# Patient Record
Sex: Female | Born: 1975 | Race: Black or African American | Hispanic: No | Smoking: Never smoker
Health system: Southern US, Community
[De-identification: ages and names within clinical notes are randomized; demographics above are authoritative.]

## PROBLEM LIST (undated history)

## (undated) DIAGNOSIS — M797 Fibromyalgia: Secondary | ICD-10-CM

## (undated) DIAGNOSIS — E785 Hyperlipidemia, unspecified: Secondary | ICD-10-CM

## (undated) DIAGNOSIS — J40 Bronchitis, not specified as acute or chronic: Secondary | ICD-10-CM

## (undated) HISTORY — PX: HERNIA REPAIR: SHX51

## (undated) HISTORY — DX: Hyperlipidemia, unspecified: E78.5

## (undated) HISTORY — DX: Bronchitis, not specified as acute or chronic: J40

## (undated) HISTORY — PX: TUBAL LIGATION: SHX77

---

## 2014-01-05 HISTORY — PX: BUNIONECTOMY: SHX129

## 2015-03-11 ENCOUNTER — Other Ambulatory Visit: Payer: Self-pay | Admitting: Physician Assistant

## 2015-03-11 DIAGNOSIS — R1011 Right upper quadrant pain: Secondary | ICD-10-CM

## 2015-03-12 ENCOUNTER — Ambulatory Visit
Admission: RE | Admit: 2015-03-12 | Discharge: 2015-03-12 | Disposition: A | Discharge status: Home / Self Care | Payer: Medicare Other | Source: Ambulatory Visit | Attending: Physician Assistant | Admitting: Physician Assistant

## 2015-03-12 DIAGNOSIS — R1011 Right upper quadrant pain: Secondary | ICD-10-CM

## 2015-03-13 ENCOUNTER — Emergency Department (HOSPITAL_COMMUNITY)
Admission: EM | Admit: 2015-03-13 | Discharge: 2015-03-13 | Disposition: A | Discharge status: Home / Self Care | Payer: Medicare Other | Attending: Emergency Medicine | Admitting: Emergency Medicine

## 2015-03-13 ENCOUNTER — Encounter (HOSPITAL_COMMUNITY): Payer: Self-pay | Admitting: Family Medicine

## 2015-03-13 DIAGNOSIS — R1011 Right upper quadrant pain: Secondary | ICD-10-CM | POA: Diagnosis present

## 2015-03-13 DIAGNOSIS — R110 Nausea: Secondary | ICD-10-CM | POA: Diagnosis not present

## 2015-03-13 HISTORY — DX: Fibromyalgia: M79.7

## 2015-03-13 LAB — I-STAT BETA HCG BLOOD, ED (MC, WL, AP ONLY)

## 2015-03-13 LAB — COMPREHENSIVE METABOLIC PANEL
ALT: 22 U/L (ref 14–54)
AST: 20 U/L (ref 15–41)
Albumin: 3.6 g/dL (ref 3.5–5.0)
Alkaline Phosphatase: 53 U/L (ref 38–126)
Anion gap: 11 (ref 5–15)
BILIRUBIN TOTAL: 0.4 mg/dL (ref 0.3–1.2)
BUN: 8 mg/dL (ref 6–20)
CO2: 23 mmol/L (ref 22–32)
CREATININE: 0.91 mg/dL (ref 0.44–1.00)
Calcium: 9.6 mg/dL (ref 8.9–10.3)
Chloride: 105 mmol/L (ref 101–111)
GFR calc Af Amer: >60 mL/min (ref >60)
Glucose, Bld: 84 mg/dL (ref 65–99)
POTASSIUM: 3.5 mmol/L (ref 3.5–5.1)
Sodium: 139 mmol/L (ref 135–145)
TOTAL PROTEIN: 7.1 g/dL (ref 6.5–8.1)

## 2015-03-13 LAB — CBC
HEMATOCRIT: 37.9 % (ref 36.0–46.0)
Hemoglobin: 12.6 g/dL (ref 12.0–15.0)
MCH: 28.2 pg (ref 26.0–34.0)
MCHC: 33.2 g/dL (ref 30.0–36.0)
MCV: 84.8 fL (ref 78.0–100.0)
PLATELETS: 289 10*3/uL (ref 150–400)
RBC: 4.47 MIL/uL (ref 3.87–5.11)
RDW: 13.8 % (ref 11.5–15.5)
WBC: 5.1 10*3/uL (ref 4.0–10.5)

## 2015-03-13 LAB — LIPASE, BLOOD: Lipase: 34 U/L (ref 11–51)

## 2015-03-13 NOTE — ED Notes (Signed)
Pt called for in waiting room. NO RESPONSE

## 2015-03-13 NOTE — ED Notes (Signed)
t here for RUQ pain and nausea. Worse after eating. sts x 1 month

## 2015-03-13 NOTE — ED Notes (Signed)
Last call for pt in waiting area. No response

## 2015-03-21 ENCOUNTER — Other Ambulatory Visit: Payer: Self-pay | Admitting: Physician Assistant

## 2015-03-21 DIAGNOSIS — R1011 Right upper quadrant pain: Secondary | ICD-10-CM

## 2015-04-03 ENCOUNTER — Ambulatory Visit
Admission: RE | Admit: 2015-04-03 | Discharge: 2015-04-03 | Disposition: A | Discharge status: Home / Self Care | Payer: Medicare Other | Source: Ambulatory Visit | Attending: Physician Assistant | Admitting: Physician Assistant

## 2015-04-03 DIAGNOSIS — R1011 Right upper quadrant pain: Secondary | ICD-10-CM

## 2015-04-03 MED ORDER — IOPAMIDOL (ISOVUE-300) INJECTION 61%
125.0000 mL | Freq: Once | INTRAVENOUS | Status: AC | PRN
  Administered 2015-04-03: 125 mL via INTRAVENOUS

## 2015-04-29 ENCOUNTER — Other Ambulatory Visit (HOSPITAL_COMMUNITY): Payer: Self-pay | Admitting: Physician Assistant

## 2015-04-29 DIAGNOSIS — R1011 Right upper quadrant pain: Secondary | ICD-10-CM

## 2015-05-08 ENCOUNTER — Ambulatory Visit (HOSPITAL_COMMUNITY)
Admission: RE | Admit: 2015-05-08 | Discharge: 2015-05-08 | Disposition: A | Discharge status: Home / Self Care | Payer: Medicare Other | Source: Ambulatory Visit | Attending: Physician Assistant | Admitting: Physician Assistant

## 2015-05-08 DIAGNOSIS — R1011 Right upper quadrant pain: Secondary | ICD-10-CM | POA: Insufficient documentation

## 2015-05-08 MED ORDER — TECHNETIUM TC 99M MEBROFENIN IV KIT
5.0000 | PACK | Freq: Once | INTRAVENOUS | Status: AC | PRN
  Administered 2015-05-08: 5 via INTRAVENOUS

## 2015-10-23 ENCOUNTER — Encounter (HOSPITAL_COMMUNITY): Payer: Self-pay | Admitting: Emergency Medicine

## 2015-10-23 DIAGNOSIS — Y999 Unspecified external cause status: Secondary | ICD-10-CM | POA: Diagnosis not present

## 2015-10-23 DIAGNOSIS — Y939 Activity, unspecified: Secondary | ICD-10-CM | POA: Insufficient documentation

## 2015-10-23 DIAGNOSIS — R1013 Epigastric pain: Secondary | ICD-10-CM | POA: Insufficient documentation

## 2015-10-23 DIAGNOSIS — M791 Myalgia: Secondary | ICD-10-CM | POA: Insufficient documentation

## 2015-10-23 DIAGNOSIS — S63502A Unspecified sprain of left wrist, initial encounter: Secondary | ICD-10-CM | POA: Insufficient documentation

## 2015-10-23 DIAGNOSIS — Y92410 Unspecified street and highway as the place of occurrence of the external cause: Secondary | ICD-10-CM | POA: Diagnosis not present

## 2015-10-23 DIAGNOSIS — S6992XA Unspecified injury of left wrist, hand and finger(s), initial encounter: Secondary | ICD-10-CM | POA: Diagnosis present
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
## 2015-10-24 ENCOUNTER — Emergency Department (HOSPITAL_COMMUNITY)
Admission: EM | Admit: 2015-10-24 | Discharge: 2015-10-24 | Disposition: A | Discharge status: Home / Self Care | Payer: No Typology Code available for payment source | Attending: Emergency Medicine | Admitting: Emergency Medicine

## 2015-10-24 ENCOUNTER — Emergency Department (HOSPITAL_COMMUNITY): Payer: No Typology Code available for payment source

## 2015-10-24 DIAGNOSIS — S63502A Unspecified sprain of left wrist, initial encounter: Secondary | ICD-10-CM | POA: Diagnosis not present

## 2015-10-24 MED ORDER — HYDROCODONE-ACETAMINOPHEN 5-325 MG PO TABS
1.0000 | ORAL_TABLET | Freq: Once | ORAL | Status: AC
  Administered 2015-10-24: 1 via ORAL
  Filled 2015-10-24: qty 1

## 2015-10-24 MED ORDER — HYDROCODONE-ACETAMINOPHEN 5-325 MG PO TABS: 1.0000 | ORAL_TABLET | ORAL | 0 refills | Status: DC | PRN
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
## 2016-05-07 DIAGNOSIS — M797 Fibromyalgia: Secondary | ICD-10-CM | POA: Diagnosis not present

## 2016-05-07 DIAGNOSIS — I10 Essential (primary) hypertension: Secondary | ICD-10-CM | POA: Diagnosis not present

## 2016-05-07 DIAGNOSIS — E785 Hyperlipidemia, unspecified: Secondary | ICD-10-CM | POA: Diagnosis not present

## 2016-05-07 DIAGNOSIS — K219 Gastro-esophageal reflux disease without esophagitis: Secondary | ICD-10-CM | POA: Diagnosis not present

## 2016-05-29 ENCOUNTER — Other Ambulatory Visit (HOSPITAL_BASED_OUTPATIENT_CLINIC_OR_DEPARTMENT_OTHER): Payer: Self-pay

## 2016-05-29 DIAGNOSIS — G4730 Sleep apnea, unspecified: Secondary | ICD-10-CM

## 2016-06-02 DIAGNOSIS — E785 Hyperlipidemia, unspecified: Secondary | ICD-10-CM | POA: Diagnosis not present

## 2016-06-02 DIAGNOSIS — K219 Gastro-esophageal reflux disease without esophagitis: Secondary | ICD-10-CM | POA: Diagnosis not present

## 2016-06-02 DIAGNOSIS — Z0000 Encounter for general adult medical examination without abnormal findings: Secondary | ICD-10-CM | POA: Diagnosis not present

## 2016-06-02 DIAGNOSIS — M797 Fibromyalgia: Secondary | ICD-10-CM | POA: Diagnosis not present

## 2016-06-02 DIAGNOSIS — I10 Essential (primary) hypertension: Secondary | ICD-10-CM | POA: Diagnosis not present

## 2016-06-24 DIAGNOSIS — Z1231 Encounter for screening mammogram for malignant neoplasm of breast: Secondary | ICD-10-CM | POA: Diagnosis not present

## 2016-06-26 ENCOUNTER — Ambulatory Visit (HOSPITAL_BASED_OUTPATIENT_CLINIC_OR_DEPARTMENT_OTHER): Payer: Medicare Other | Attending: Internal Medicine | Admitting: Internal Medicine

## 2016-06-26 VITALS — Ht 67.0 in | Wt 285.0 lb

## 2016-06-26 DIAGNOSIS — R5383 Other fatigue: Secondary | ICD-10-CM | POA: Insufficient documentation

## 2016-06-26 DIAGNOSIS — Z6841 Body Mass Index (BMI) 40.0 and over, adult: Secondary | ICD-10-CM | POA: Diagnosis not present

## 2016-06-26 DIAGNOSIS — R0683 Snoring: Secondary | ICD-10-CM | POA: Diagnosis not present

## 2016-06-26 DIAGNOSIS — G4730 Sleep apnea, unspecified: Secondary | ICD-10-CM

## 2016-06-26 DIAGNOSIS — G4733 Obstructive sleep apnea (adult) (pediatric): Secondary | ICD-10-CM | POA: Diagnosis not present

## 2016-06-26 DIAGNOSIS — E669 Obesity, unspecified: Secondary | ICD-10-CM | POA: Insufficient documentation

## 2016-07-03 DIAGNOSIS — I10 Essential (primary) hypertension: Secondary | ICD-10-CM | POA: Diagnosis not present

## 2016-07-03 DIAGNOSIS — J069 Acute upper respiratory infection, unspecified: Secondary | ICD-10-CM | POA: Diagnosis not present

## 2016-07-03 DIAGNOSIS — M797 Fibromyalgia: Secondary | ICD-10-CM | POA: Diagnosis not present

## 2016-07-03 DIAGNOSIS — R05 Cough: Secondary | ICD-10-CM | POA: Diagnosis not present

## 2016-07-03 DIAGNOSIS — E785 Hyperlipidemia, unspecified: Secondary | ICD-10-CM | POA: Diagnosis not present

## 2016-07-05 DIAGNOSIS — G4733 Obstructive sleep apnea (adult) (pediatric): Secondary | ICD-10-CM | POA: Diagnosis not present

## 2016-07-05 NOTE — Procedures (Signed)
  Patient Name: Joanna Allen, Joanna Allen Date: 06/26/2016 Gender: Female D.O.B: 1975/12/21 Age (years): 40 Referring Provider: Doristine Section Bonsu Height (inches): 93 Interpreting Physician: Baird Lyons MD, ABSM Weight (lbs): 285 RPSGT: Baxter Flattery BMI: 30 MRN: [id number] Neck Size: 15.50 CLINICAL INFORMATION Sleep Study Type: NPSG  Indication for sleep study: Fatigue, Obesity, OSA, Witnessed Apneas  Epworth Sleepiness Score:   15/ 24  SLEEP STUDY TECHNIQUE As per the AASM Manual for the Scoring of Sleep and Associated Events v2.3 (April 2016) with a hypopnea requiring 4% desaturations.  The channels recorded and monitored were frontal, central and occipital EEG, electrooculogram (EOG), submentalis EMG (chin), nasal and oral airflow, thoracic and abdominal wall motion, anterior tibialis EMG, snore microphone, electrocardiogram, and pulse oximetry.  MEDICATIONS Medications self-administered by patient taken the night of the study : none reported  SLEEP ARCHITECTURE The study was initiated at 10:04:09 PM and ended at 4:30:50 AM.  Sleep onset time was 45.6 minutes and the sleep efficiency was 67.6%. The total sleep time was 261.6 minutes.  Stage REM latency was 85.0 minutes.  The patient spent 11.47% of the night in stage N1 sleep, 71.71% in stage N2 sleep, 0.00% in stage N3 and 16.82% in REM.  Alpha intrusion was absent.  Supine sleep was 0.19%.  RESPIRATORY PARAMETERS The overall apnea/hypopnea index (AHI) was 12.4 per hour. There were 10 total apneas, including 1 obstructive, 8 central and 1 mixed apneas. There were 44 hypopneas and 0 RERAs.  The AHI during Stage REM sleep was 61.4 per hour.  AHI while supine was 0.0 per hour.  The mean oxygen saturation was 93.50%. The minimum SpO2 during sleep was 78.00%.  Soft snoring was noted during this study.  CARDIAC DATA The 2 lead EKG demonstrated sinus rhythm. The mean heart rate was 81.38 beats per minute. Other EKG  findings include: None.  LEG MOVEMENT DATA The total PLMS were 0 with a resulting PLMS index of 0.00. Associated arousal with leg movement index was 0.0 .  IMPRESSIONS - Mild obstructive sleep apnea occurred during this study (AHI = 12.4/h). - No significant central sleep apnea occurred during this study (CAI = 1.8/h). - Moderate oxygen desaturation was noted during this study (Min O2 = 78.00%). - The patient snored with Soft snoring volume. - No cardiac abnormalities were noted during this study. - Clinically significant periodic limb movements did not occur during sleep. No significant associated arousals.  DIAGNOSIS - Obstructive Sleep Apnea (327.23 [G47.33 ICD-10])  RECOMMENDATIONS - Therapeutic CPAP titration to determine optimal pressure required to alleviate sleep disordered breathing. Other options based on clinical judgment. - Avoid alcohol, sedatives and other CNS depressants that may worsen sleep apnea and disrupt normal sleep architecture. - Sleep hygiene should be reviewed to assess factors that may improve sleep quality. - Weight management and regular exercise should be initiated or continued if appropriate.  [Electronically signed] 07/05/2016 11:12 AM  Baird Lyons MD, ABSM Diplomate, American Board of Sleep Medicine   NPI: [id number]  [place], American Board of Sleep Medicine  ELECTRONICALLY SIGNED ON:  07/05/2016, 10:53 AM [place] PH: [phone]   FX: [phone] [place]

## 2016-07-15 DIAGNOSIS — R05 Cough: Secondary | ICD-10-CM | POA: Diagnosis not present

## 2016-07-15 DIAGNOSIS — E785 Hyperlipidemia, unspecified: Secondary | ICD-10-CM | POA: Diagnosis not present

## 2016-07-29 DIAGNOSIS — E559 Vitamin D deficiency, unspecified: Secondary | ICD-10-CM | POA: Diagnosis not present

## 2016-07-29 DIAGNOSIS — K219 Gastro-esophageal reflux disease without esophagitis: Secondary | ICD-10-CM | POA: Diagnosis not present

## 2016-07-29 DIAGNOSIS — M797 Fibromyalgia: Secondary | ICD-10-CM | POA: Diagnosis not present

## 2016-07-29 DIAGNOSIS — I10 Essential (primary) hypertension: Secondary | ICD-10-CM | POA: Diagnosis not present

## 2016-08-26 DIAGNOSIS — M797 Fibromyalgia: Secondary | ICD-10-CM | POA: Diagnosis not present

## 2016-08-26 DIAGNOSIS — Z79899 Other long term (current) drug therapy: Secondary | ICD-10-CM | POA: Diagnosis not present

## 2016-09-01 ENCOUNTER — Other Ambulatory Visit (HOSPITAL_BASED_OUTPATIENT_CLINIC_OR_DEPARTMENT_OTHER): Payer: Self-pay

## 2016-09-01 DIAGNOSIS — G4733 Obstructive sleep apnea (adult) (pediatric): Secondary | ICD-10-CM

## 2016-09-22 DIAGNOSIS — I10 Essential (primary) hypertension: Secondary | ICD-10-CM | POA: Diagnosis not present

## 2016-09-22 DIAGNOSIS — E785 Hyperlipidemia, unspecified: Secondary | ICD-10-CM | POA: Diagnosis not present

## 2016-09-22 DIAGNOSIS — M797 Fibromyalgia: Secondary | ICD-10-CM | POA: Diagnosis not present

## 2016-09-22 DIAGNOSIS — K219 Gastro-esophageal reflux disease without esophagitis: Secondary | ICD-10-CM | POA: Diagnosis not present

## 2016-09-23 DIAGNOSIS — M797 Fibromyalgia: Secondary | ICD-10-CM | POA: Diagnosis not present

## 2016-09-23 DIAGNOSIS — Z7689 Persons encountering health services in other specified circumstances: Secondary | ICD-10-CM | POA: Diagnosis not present

## 2016-09-25 DIAGNOSIS — M797 Fibromyalgia: Secondary | ICD-10-CM | POA: Diagnosis not present

## 2016-09-25 DIAGNOSIS — Z79899 Other long term (current) drug therapy: Secondary | ICD-10-CM | POA: Diagnosis not present

## 2016-10-02 ENCOUNTER — Ambulatory Visit (HOSPITAL_BASED_OUTPATIENT_CLINIC_OR_DEPARTMENT_OTHER): Payer: Medicare Other | Attending: Internal Medicine | Admitting: Internal Medicine

## 2016-10-02 VITALS — Ht 67.0 in | Wt 285.0 lb

## 2016-10-02 DIAGNOSIS — G4733 Obstructive sleep apnea (adult) (pediatric): Secondary | ICD-10-CM

## 2016-10-02 DIAGNOSIS — R0683 Snoring: Secondary | ICD-10-CM | POA: Insufficient documentation

## 2016-10-04 ENCOUNTER — Encounter (HOSPITAL_COMMUNITY): Payer: Self-pay

## 2016-10-04 ENCOUNTER — Inpatient Hospital Stay (HOSPITAL_COMMUNITY)
Admission: AD | Admit: 2016-10-04 | Discharge: 2016-10-04 | Disposition: A | Discharge status: Home / Self Care | Payer: Medicare Other | Source: Ambulatory Visit | Attending: Family Medicine | Admitting: Family Medicine

## 2016-10-04 DIAGNOSIS — N946 Dysmenorrhea, unspecified: Secondary | ICD-10-CM | POA: Diagnosis not present

## 2016-10-04 DIAGNOSIS — N915 Oligomenorrhea, unspecified: Secondary | ICD-10-CM | POA: Diagnosis not present

## 2016-10-04 DIAGNOSIS — N760 Acute vaginitis: Secondary | ICD-10-CM

## 2016-10-04 DIAGNOSIS — Z3202 Encounter for pregnancy test, result negative: Secondary | ICD-10-CM | POA: Insufficient documentation

## 2016-10-04 DIAGNOSIS — B9689 Other specified bacterial agents as the cause of diseases classified elsewhere: Secondary | ICD-10-CM

## 2016-10-04 DIAGNOSIS — N939 Abnormal uterine and vaginal bleeding, unspecified: Secondary | ICD-10-CM

## 2016-10-04 LAB — URINALYSIS, ROUTINE W REFLEX MICROSCOPIC
Bilirubin Urine: NEGATIVE
Glucose, UA: NEGATIVE mg/dL
KETONES UR: NEGATIVE mg/dL
Leukocytes, UA: NEGATIVE
Nitrite: NEGATIVE
PH: 5 (ref 5.0–8.0)
PROTEIN: NEGATIVE mg/dL
Specific Gravity, Urine: 1.026 (ref 1.005–1.030)

## 2016-10-04 LAB — CBC
HCT: 36.3 % (ref 36.0–46.0)
Hemoglobin: 11.8 g/dL — ABNORMAL LOW (ref 12.0–15.0)
MCH: 28.2 pg (ref 26.0–34.0)
MCHC: 32.5 g/dL (ref 30.0–36.0)
MCV: 86.8 fL (ref 78.0–100.0)
PLATELETS: 275 10*3/uL (ref 150–400)
RBC: 4.18 MIL/uL (ref 3.87–5.11)
RDW: 14.5 % (ref 11.5–15.5)
WBC: 5.7 10*3/uL (ref 4.0–10.5)

## 2016-10-04 LAB — POCT PREGNANCY, URINE: Preg Test, Ur: NEGATIVE

## 2016-10-04 LAB — WET PREP, GENITAL
Sperm: NONE SEEN
TRICH WET PREP: NONE SEEN
YEAST WET PREP: NONE SEEN

## 2016-10-04 LAB — HCG, SERUM, QUALITATIVE: PREG SERUM: NEGATIVE

## 2016-10-04 MED ORDER — IBUPROFEN 600 MG PO TABS: 600.0000 mg | ORAL_TABLET | Freq: Four times a day (QID) | ORAL | 0 refills | Status: DC | PRN

## 2016-10-04 MED ORDER — KETOROLAC TROMETHAMINE 60 MG/2ML IM SOLN
60.0000 mg | Freq: Once | INTRAMUSCULAR | Status: AC
  Administered 2016-10-04: 60 mg via INTRAMUSCULAR
  Filled 2016-10-04: qty 2

## 2016-10-04 MED ORDER — METRONIDAZOLE 500 MG PO TABS: 500.0000 mg | ORAL_TABLET | Freq: Two times a day (BID) | ORAL | 0 refills | Status: DC

## 2016-10-04 NOTE — MAU Provider Note (Cosign Needed)
Chief Complaint: Metrorrhagia   None     SUBJECTIVE HPI: Joanna Allen is a 41 y.o. G4P4000 who presents to maternity admissions reporting more frequent periods in recent months, associated with increased menstrual cramping and abdominal pain.  The pain is intermittent, bilateral low to mid abdomen with slightly more pain to the right side.  She reports that her pain started with her period in August, which came a few days early, then her period in early September was only 1 day long. She started bleeding today with moderate menstrual flow.  She has hx of fibromyalgia and is taking Lyrica and Tylenol for pain which is not helping.  She has Rx for narcotic medication for her fibromyalgia but she reports she does not take it very often and has not taken it for this pain.  She does not report a change in discharge but has noticed an odor in recent weeks.  There are no other associated symptoms. She denies vaginal itching/burning, urinary symptoms, h/a, dizziness, n/v, or fever/chills.     HPI  Past Medical History:  Diagnosis Date  . Fibromyalgia    Past Surgical History:  Procedure Laterality Date  . HERNIA REPAIR    . TUBAL LIGATION     Social History   Social History  . Marital status: Legally Separated    Spouse name: N/A  . Number of children: N/A  . Years of education: N/A   Occupational History  . Not on file.   Social History Main Topics  . Smoking status: Never Smoker  . Smokeless tobacco: Never Used  . Alcohol use Yes  . Drug use: No  . Sexual activity: Yes    Birth control/ protection: Surgical   Other Topics Concern  . Not on file   Social History Narrative  . No narrative on file   No current facility-administered medications on file prior to encounter.    No current outpatient prescriptions on file prior to encounter.   Allergies  Allergen Reactions  . Amoxicillin Hives    ROS:  Review of Systems  Constitutional: Negative for chills, fatigue and  fever.  Respiratory: Negative for shortness of breath.   Cardiovascular: Negative for chest pain.  Gastrointestinal: Positive for abdominal pain.  Genitourinary: Positive for pelvic pain and vaginal bleeding. Negative for difficulty urinating, dysuria, flank pain, vaginal discharge and vaginal pain.  Neurological: Negative for dizziness and headaches.  Psychiatric/Behavioral: Negative.      I have reviewed patient's Past Medical Hx, Surgical Hx, Family Hx, Social Hx, medications and allergies.   Physical Exam  No data found.  Constitutional: Well-developed, well-nourished female in no acute distress.  Cardiovascular: normal rate Respiratory: normal effort GI: Abd soft, non-tender. Pos BS x 4 MS: Extremities nontender, no edema, normal ROM Neurologic: Alert and oriented x 4.  GU: Neg CVAT.  PELVIC EXAM: Wet prep/GC collected by blind swab    LAB RESULTS Results for orders placed or performed during the hospital encounter of 10/04/16 (from the past 24 hour(s))  Urinalysis, Routine w reflex microscopic     Status: Abnormal   Collection Time: 10/04/16  2:55 PM  Result Value Ref Range   Color, Urine YELLOW YELLOW   APPearance HAZY (A) CLEAR   Specific Gravity, Urine 1.026 1.005 - 1.030   pH 5.0 5.0 - 8.0   Glucose, UA NEGATIVE NEGATIVE mg/dL   Hgb urine dipstick LARGE (A) NEGATIVE   Bilirubin Urine NEGATIVE NEGATIVE   Ketones, ur NEGATIVE NEGATIVE mg/dL   Protein,  ur NEGATIVE NEGATIVE mg/dL   Nitrite NEGATIVE NEGATIVE   Leukocytes, UA NEGATIVE NEGATIVE   RBC / HPF 6-30 0 - 5 RBC/hpf   WBC, UA 0-5 0 - 5 WBC/hpf   Bacteria, UA RARE (A) NONE SEEN   Squamous Epithelial / LPF 0-5 (A) NONE SEEN   Mucus PRESENT   Pregnancy, urine POC     Status: None   Collection Time: 10/04/16  3:02 PM  Result Value Ref Range   Preg Test, Ur NEGATIVE NEGATIVE  hCG, serum, qualitative     Status: None   Collection Time: 10/04/16  3:40 PM  Result Value Ref Range   Preg, Serum NEGATIVE  NEGATIVE  CBC     Status: Abnormal   Collection Time: 10/04/16  3:40 PM  Result Value Ref Range   WBC 5.7 4.0 - 10.5 K/uL   RBC 4.18 3.87 - 5.11 MIL/uL   Hemoglobin 11.8 (L) 12.0 - 15.0 g/dL   HCT 36.3 36.0 - 46.0 %   MCV 86.8 78.0 - 100.0 fL   MCH 28.2 26.0 - 34.0 pg   MCHC 32.5 30.0 - 36.0 g/dL   RDW 14.5 11.5 - 15.5 %   Platelets 275 150 - 400 K/uL  Wet prep, genital     Status: Abnormal   Collection Time: 10/04/16  4:29 PM  Result Value Ref Range   Yeast Wet Prep HPF POC NONE SEEN NONE SEEN   Trich, Wet Prep NONE SEEN NONE SEEN   Clue Cells Wet Prep HPF POC PRESENT (A) NONE SEEN   WBC, Wet Prep HPF POC FEW (A) NONE SEEN   Sperm NONE SEEN        IMAGING No results found.  MAU Management/MDM: Ordered labs and reviewed results.  Discussed low/normal hgb with pt, encouraged increased iron in her diet and women's multivitamin.  No acute abdomen, no evidence of infection, other than clue cells on today's testing.  With pt report of vaginal odor, will treat for BV with Flagyl 500 mg BID x 7 days. Bleeding pattern is likely perimenopausal but will further investigate with outpatient [place] and follow up in [facility] office at pt preference.  Rx for Flagyl as mentioned and ibuprofen 600 mg PO Q 6 hours PRN.  Treatments in MAU included Toradol 60 mg IM x 1. Pt stable at time of discharge.  ASSESSMENT 1. Dysmenorrhea   2. Abnormal uterine bleeding (AUB)   3. Bacterial vaginosis     PLAN Discharge home with bleeding precautions Dictation #1 [id number]  [id number]  Allergies as of 10/04/2016      Reactions   Amoxicillin Hives      Medication List    TAKE these medications   acetaminophen 500 MG tablet Commonly known as:  TYLENOL Take 1,000 mg by mouth every 6 (six) hours as needed for mild pain.   cetirizine 10 MG tablet Commonly known as:  ZYRTEC Take 10 mg by mouth daily.   ibuprofen 600 MG tablet Commonly known as:  ADVIL,MOTRIN Take 1 tablet (600 mg total) by  mouth every 6 (six) hours as needed.   metroNIDAZOLE 500 MG tablet Commonly known as:  FLAGYL Take 1 tablet (500 mg total) by mouth 2 (two) times daily.   ranitidine 300 MG tablet Commonly known as:  ZANTAC Take 300 mg by mouth at bedtime.   Vitamin D (Ergocalciferol) 50000 units Caps capsule Commonly known as:  DRISDOL Take 1 capsule by mouth once a week. Saturday  Discharge Care Instructions        Start     Ordered   10/04/16 0000  ibuprofen (ADVIL,MOTRIN) 600 MG tablet  Every 6 hours PRN    Question:  Supervising Provider  Answer:  Donnamae Jude   10/04/16 1822   10/04/16 0000  US PELVIS (TRANSABDOMINAL ONLY)    Question Answer Comment  Reason for exam: abnormal uterine bleeding, dysmenorrhea   [facility]      10/04/16 1825   10/04/16 0000  US PELVIS TRANSVANGINAL NON-OB (TV ONLY)    Question Answer Comment  Reason for exam: abnormal uterine bleeding, dysmenorrhea   Preferred imaging location? [facility]      10/04/16 1825   10/04/16 0000  metroNIDAZOLE (FLAGYL) 500 MG tablet  2 times daily    Question:  Supervising Provider  Answer:  Donnamae Jude   10/04/16 [place] for [place] Follow up.   Specialty:  Obstetrics and Gynecology Why:  The office will call you with follow up  Contact information: [place] Certified Nurse-Midwife 10/04/2016  6:33 PM

## 2016-10-04 NOTE — MAU Note (Signed)
Last few months having irregular periods, starting to come earlier than usually, in September only bled one day,  Two negative UPTs.  Feels heaviness in the bottom of stomach, breast are heavy, milky substance coming out of nipples, have tubal 17 years ago.

## 2016-10-05 LAB — GC/CHLAMYDIA PROBE AMP (~~LOC~~) NOT AT ARMC
Chlamydia: NEGATIVE
Neisseria Gonorrhea: NEGATIVE

## 2016-10-10 DIAGNOSIS — G4733 Obstructive sleep apnea (adult) (pediatric): Secondary | ICD-10-CM | POA: Diagnosis not present

## 2016-10-10 NOTE — Procedures (Signed)
   Patient Name: Joanna Allen, Joanna Allen Date: 10/02/2016 Gender: Female D.O.B: Aug 02, 1975 Age (years): 41 Referring Provider: Doristine Section Bonsu Height (inches): 46 Interpreting Physician: Joanna Lyons MD, ABSM Weight (lbs): 285 RPSGT: Baxter Flattery BMI: 46 MRN: [id number] Neck Size: 15.50 CLINICAL INFORMATION The patient is referred for a CPAP titration to treat sleep apnea.  Date of NPSG, Split Night or HST:  06/26/16-  AHI 12.4/ hr, desaturation to 78%, body weight 285 lbs  SLEEP STUDY TECHNIQUE As per the AASM Manual for the Scoring of Sleep and Associated Events v2.3 (April 2016) with a hypopnea requiring 4% desaturations.  The channels recorded and monitored were frontal, central and occipital EEG, electrooculogram (EOG), submentalis EMG (chin), nasal and oral airflow, thoracic and abdominal wall motion, anterior tibialis EMG, snore microphone, electrocardiogram, and pulse oximetry. Continuous positive airway pressure (CPAP) was initiated at the beginning of the study and titrated to treat sleep-disordered breathing.  MEDICATIONS Medications self-administered by patient taken the night of the study : none reported  TECHNICIAN COMMENTS Comments added by technician: Patient had difficulty initiating sleep.  Comments added by scorer: N/A RESPIRATORY PARAMETERS Optimal PAP Pressure (cm): 11 AHI at Optimal Pressure (/hr): 0.0 Overall Minimal O2 (%): 84.00 Supine % at Optimal Pressure (%): 0 Minimal O2 at Optimal Pressure (%): 96.0    SLEEP ARCHITECTURE The study was initiated at 10:08:57 PM and ended at 4:39:19 AM.  Sleep onset time was 18.6 minutes and the sleep efficiency was 90.5%. The total sleep time was 353.3 minutes.  The patient spent 2.41% of the night in stage N1 sleep, 62.35% in stage N2 sleep, 0.00% in stage N3 and 35.24% in REM.Stage REM latency was 22.5 minutes  Wake after sleep onset was 18.5. Alpha intrusion was absent. Supine sleep was 6.65%.  CARDIAC  DATA The 2 lead EKG demonstrated sinus rhythm. The mean heart rate was 69.96 beats per minute. Other EKG findings include: None.  LEG MOVEMENT DATA The total Periodic Limb Movements of Sleep (PLMS) were 0. The PLMS index was 0.00. A PLMS index of <15 is considered normal in adults.  IMPRESSIONS - The optimal PAP pressure was 11 cm of water. - Central sleep apnea was not noted during this titration (CAI = 0.0/h). - Moderate oxygen desaturations were observed during this titration (min O2 = 84.00%). - No snoring was audible during this study. - No cardiac abnormalities were observed during this study. - Clinically significant periodic limb movements were not noted during this study. Arousals associated with PLMs were rare.  DIAGNOSIS - Obstructive Sleep Apnea (327.23 [G47.33 ICD-10])  RECOMMENDATIONS - Trial of CPAP therapy on 11 cm H2O with a Medium size Resmed Full Face Mask AirFit F20 mask and heated humidification. - Be careful with alcohol, sedatives and other CNS depressants that may worsen sleep apnea and disrupt normal sleep architecture. - Sleep hygiene should be reviewed to assess factors that may improve sleep quality. - Weight management and regular exercise should be initiated or continued.  [Electronically signed] 10/10/2016 11:16 AM  Joanna Lyons MD, ABSM Diplomate, American Board of Sleep Medicine   NPI: [id number]  [place], American Board of Sleep Medicine  ELECTRONICALLY SIGNED ON:  10/10/2016, 11:13 AM [place] PH: [phone]   FX: [phone] [place]

## 2016-10-13 ENCOUNTER — Ambulatory Visit (HOSPITAL_COMMUNITY)
Admission: RE | Admit: 2016-10-13 | Discharge: 2016-10-13 | Disposition: A | Discharge status: Home / Self Care | Payer: Medicare Other | Source: Ambulatory Visit | Attending: Advanced Practice Midwife | Admitting: Advanced Practice Midwife

## 2016-10-13 DIAGNOSIS — D259 Leiomyoma of uterus, unspecified: Secondary | ICD-10-CM | POA: Insufficient documentation

## 2016-10-13 DIAGNOSIS — N852 Hypertrophy of uterus: Secondary | ICD-10-CM | POA: Insufficient documentation

## 2016-10-13 DIAGNOSIS — N946 Dysmenorrhea, unspecified: Secondary | ICD-10-CM | POA: Diagnosis not present

## 2016-10-13 DIAGNOSIS — N939 Abnormal uterine and vaginal bleeding, unspecified: Secondary | ICD-10-CM

## 2016-10-14 ENCOUNTER — Telehealth: Payer: Self-pay | Admitting: General Practice

## 2016-10-14 NOTE — Telephone Encounter (Signed)
Called patient and informed her of ultrasound results and offered follow up appt with one of our providers. Patient verbalized understanding and states that would be fine. Told patient someone from our front office staff will call her with an appt. Patient verbalized understanding & had no questions

## 2016-10-20 ENCOUNTER — Encounter: Payer: Self-pay | Admitting: Obstetrics and Gynecology

## 2016-10-28 ENCOUNTER — Ambulatory Visit (INDEPENDENT_AMBULATORY_CARE_PROVIDER_SITE_OTHER): Payer: Medicare Other | Admitting: Pulmonary Disease

## 2016-10-28 ENCOUNTER — Encounter: Payer: Self-pay | Admitting: Pulmonary Disease

## 2016-10-28 DIAGNOSIS — E785 Hyperlipidemia, unspecified: Secondary | ICD-10-CM | POA: Diagnosis not present

## 2016-10-28 DIAGNOSIS — E559 Vitamin D deficiency, unspecified: Secondary | ICD-10-CM | POA: Diagnosis not present

## 2016-10-28 DIAGNOSIS — I10 Essential (primary) hypertension: Secondary | ICD-10-CM | POA: Diagnosis not present

## 2016-10-28 DIAGNOSIS — G4733 Obstructive sleep apnea (adult) (pediatric): Secondary | ICD-10-CM | POA: Insufficient documentation

## 2016-10-28 DIAGNOSIS — M797 Fibromyalgia: Secondary | ICD-10-CM | POA: Diagnosis not present

## 2016-10-28 DIAGNOSIS — K219 Gastro-esophageal reflux disease without esophagitis: Secondary | ICD-10-CM | POA: Diagnosis not present

## 2016-10-28 NOTE — Assessment & Plan Note (Signed)
Although OSA is mild, she does seem to be symptomatic and sleep appears to be very disturbing non-refreshing. Difficult to tease out primary sleep disorder from symptoms of fibromyalgia  Prescription will be sent to DME for CPAP 11 cm H2O with a Medium size Resmed Full Face Mask AirFit F20 mask and heated humidification    The pathophysiology of obstructive sleep apnea , it's cardiovascular consequences & modes of treatment including CPAP were discused with the patient in detail & they evidenced understanding.

## 2016-10-28 NOTE — Patient Instructions (Signed)
Prescription will be sent to DME for CPAP 11 cm H2O with a Medium size Resmed Full Face Mask AirFit F20 mask and heated humidification  Expectation would be that she use it for at least 4 hours every night

## 2016-10-28 NOTE — Progress Notes (Signed)
   Subjective:    Patient ID: Genesee Nase, female    DOB: 11-08-1975, 41 y.o.   MRN: [id number]  HPI  41 year old obese woman fibromyalgia presents for evaluation of obstructive sleep apnea. Epworth sleepiness score is 14 and she reports sleepiness and radius social situations such as sitting in a public place, as a passenger in a car, lying down to rest in the afternoons or watching TV. She has migraine headaches and uses Lyrica and has just been started on Nucynta in the last 3 weeks. She reports difficulty falling asleep and maintaining her sleep. Bedtime is 11 PM to 1 AM but very often she doesn't fall asleep until 2 or 3 AM. Then she sleeps for 4-5 hours and is up by 6:54 AM. She also reports a lot of nocturnal awakenings and non-refreshing sleep. She sleeps on her left side with 2 pillows, denies nocturia. Wakes up with occasional headaches and dryness of mouth. Often takes a nap in the afternoons for about an hour in her couch which is refreshing. She's gained 30-45 pounds over the last 2 years. She drinks 2 caffeinated sodas daily. She is trying to lose weight and has been started on phentermine. She has been diagnosed with fibromyalgia since 2009. Her blood pressure is controlled and she is in fact not taking blood pressure medications are listed for at least the last 2 years. She has 4 children youngest 72 home she lives with  There is no history suggestive of cataplexy, sleep paralysis or parasomnias  I reviewed her sleep studies listed below, surprisingly she slept for 261 minutes on her baseline polysomnogram in then for more than 6 hours during her CPAP titration study  Significant tests/ events reviewed  PSG 06/2016 showed mild OSA with AHI 12/hour with moderate oxygen desaturation, lowest 78% CPAP titration 09/2016>> 11 cm, medium fullface mask  Review of Systems  Constitutional: Negative for fever and unexpected weight change.  HENT: Positive for dental problem. Negative  for congestion, ear pain, nosebleeds, postnasal drip, rhinorrhea, sinus pressure, sneezing, sore throat and trouble swallowing.   Eyes: Positive for redness. Negative for itching.  Respiratory: Positive for cough. Negative for chest tightness, shortness of breath and wheezing.   Cardiovascular: Positive for leg swelling. Negative for palpitations.  Gastrointestinal: Negative for nausea and vomiting.  Genitourinary: Negative for dysuria.  Musculoskeletal: Positive for joint swelling.  Skin: Negative for rash.  Allergic/Immunologic: Positive for environmental allergies. Negative for food allergies and immunocompromised state.  Neurological: Positive for headaches.  Hematological: Bruises/bleeds easily.  Psychiatric/Behavioral: Negative for dysphoric mood. The patient is not nervous/anxious.        Objective:   Physical Exam  Gen. Pleasant, obese, in no distress, normal affect ENT - no lesions, no post nasal drip, class 2-3 airway Neck: No JVD, no thyromegaly, no carotid bruits Lungs: no use of accessory muscles, no dullness to percussion, decreased without rales or rhonchi  Cardiovascular: Rhythm regular, heart sounds  normal, no murmurs or gallops, no peripheral edema Abdomen: soft and non-tender, no hepatosplenomegaly, BS normal. Musculoskeletal: No deformities, no cyanosis or clubbing Neuro:  alert, non focal, no tremors       Assessment & Plan:

## 2016-11-12 DIAGNOSIS — G4733 Obstructive sleep apnea (adult) (pediatric): Secondary | ICD-10-CM | POA: Diagnosis not present

## 2016-11-23 ENCOUNTER — Encounter: Payer: Self-pay | Admitting: Obstetrics and Gynecology

## 2016-11-23 ENCOUNTER — Ambulatory Visit (INDEPENDENT_AMBULATORY_CARE_PROVIDER_SITE_OTHER): Payer: Medicare Other | Admitting: Obstetrics and Gynecology

## 2016-11-23 VITALS — BP 125/74 | HR 71 | Ht 67.0 in | Wt 283.8 lb

## 2016-11-23 DIAGNOSIS — R102 Pelvic and perineal pain: Secondary | ICD-10-CM | POA: Diagnosis not present

## 2016-11-23 DIAGNOSIS — D219 Benign neoplasm of connective and other soft tissue, unspecified: Secondary | ICD-10-CM

## 2016-11-23 DIAGNOSIS — Z1231 Encounter for screening mammogram for malignant neoplasm of breast: Secondary | ICD-10-CM

## 2016-11-23 DIAGNOSIS — Z3169 Encounter for other general counseling and advice on procreation: Secondary | ICD-10-CM

## 2016-11-23 DIAGNOSIS — Z1239 Encounter for other screening for malignant neoplasm of breast: Secondary | ICD-10-CM

## 2016-11-23 NOTE — Progress Notes (Signed)
Patient and/or legal guardian verbally declined to meet with [place] about presenting concerns.( elevated phq9).

## 2016-11-23 NOTE — Progress Notes (Signed)
GYNECOLOGY OFFICE VISIT NOTE  History:  41 y.o. [id number] here today for follow up from ED for pelvic pain. Has had pain worsening the last few months and was told she had fibroids in ED. She also reports lighter periods the last 6 months or so, they are shorter in cycle but still monthly. Still 3 days long, just lighter. She would like treatment for her fibroids.  Usually has one BM per month, but lately, with BM daily. Thinks it may be due to CPAP which she started on week ago. No other bladder/stomach issues.  Past Medical History:  Diagnosis Date  . Bronchitis   . Fibromyalgia   . Hyperlipidemia     Past Surgical History:  Procedure Laterality Date  . BUNIONECTOMY  2016  . HERNIA REPAIR    . TUBAL LIGATION       Current Outpatient Medications:  .  acetaminophen (TYLENOL) 500 MG tablet, Take 1,000 mg by mouth every 6 (six) hours as needed for mild pain., Disp: , Rfl:  .  albuterol (PROVENTIL HFA;VENTOLIN HFA) 108 (90 Base) MCG/ACT inhaler, Inhale into the lungs every 6 (six) hours as needed for wheezing or shortness of breath., Disp: , Rfl:  .  atorvastatin (LIPITOR) 40 MG tablet, Take 40 mg by mouth daily., Disp: , Rfl:  .  cetirizine (ZYRTEC) 10 MG tablet, Take 10 mg by mouth daily., Disp: , Rfl: 0 .  cyclobenzaprine (FLEXERIL) 10 MG tablet, Take 10 mg by mouth 3 (three) times daily as needed for muscle spasms., Disp: , Rfl:  .  diclofenac (VOLTAREN) 75 MG EC tablet, , Disp: , Rfl:  .  ergocalciferol (VITAMIN D2) 50000 units capsule, Take 50,000 Units by mouth once a week., Disp: , Rfl:  .  hydrochlorothiazide (HYDRODIURIL) 25 MG tablet, Take 25 mg by mouth daily., Disp: , Rfl:  .  ibuprofen (ADVIL,MOTRIN) 600 MG tablet, Take 1 tablet (600 mg total) by mouth every 6 (six) hours as needed., Disp: 30 tablet, Rfl: 0 .  NUCYNTA ER 100 MG 12 hr tablet, , Disp: , Rfl: 0 .  phentermine 37.5 MG capsule, Take 37.5 mg by mouth every morning., Disp: , Rfl:  .  pregabalin (LYRICA) 50 MG  capsule, Take 50 mg by mouth 2 (two) times daily., Disp: , Rfl:  .  ranitidine (ZANTAC) 300 MG tablet, Take 300 mg by mouth at bedtime., Disp: , Rfl: 0 .  SUMAtriptan (IMITREX) 50 MG tablet, Take 50 mg by mouth once. May repeat in 2 hours if headache persists or recurs., Disp: , Rfl:  .  topiramate (TOPAMAX) 50 MG tablet, Take 50 mg by mouth 2 (two) times daily., Disp: , Rfl:  .  metroNIDAZOLE (FLAGYL) 500 MG tablet, Take 1 tablet (500 mg total) by mouth 2 (two) times daily., Disp: 14 tablet, Rfl: 0 .  Vitamin D, Ergocalciferol, (DRISDOL) 50000 units CAPS capsule, Take 1 capsule by mouth once a week. Saturday, Disp: , Rfl: 0  The following portions of the patient's history were reviewed and updated as appropriate: allergies, current medications, past family history, past medical history, past social history, past surgical history and problem list.   Health Maintenance:  Last pap: reports normal within last year Last mammogram: ordered  Review of Systems:  Pertinent items noted in HPI and remainder of comprehensive ROS otherwise negative.   Objective:  Physical Exam BP 125/74   Pulse 71   Ht 5\' 7"  (1.702 m)   Wt 283 lb 12.8 oz (128.7 kg)  LMP 10/29/2016   BMI 44.45 kg/m  CONSTITUTIONAL: Well-developed, well-nourished female in no acute distress.  HENT:  Normocephalic, atraumatic. External right and left ear normal. Oropharynx is clear and moist EYES: Conjunctivae and EOM are normal. Pupils are equal, round, and reactive to light. No scleral icterus.  NECK: Normal range of motion, supple, no masses SKIN: Skin is warm and dry. No rash noted. Not diaphoretic. No erythema. No pallor. NEUROLOGIC: Alert and oriented to person, place, and time. Normal reflexes, muscle tone coordination. No cranial nerve deficit noted. PSYCHIATRIC: Normal mood and affect. Normal behavior. Normal judgment and thought content. CARDIOVASCULAR: Normal heart rate noted RESPIRATORY: Effort and breath sounds  normal, no problems with respiration noted ABDOMEN: Soft, no distention noted.   PELVIC: normal appearing external female genitalia, normal appearing cervix and vaginal mucosa, no abnormal discharge or lesions noted, uterus severely retroverted, likely due to fibroid palpated at posterior fornix, some tenderness there, no adnexal tendereness MUSCULOSKELETAL: Normal range of motion. No edema noted.  Labs and Imaging No results found.  Assessment & Plan:  1. Fibroid Had extensive conversation regarding options for management of fibroids however patient desires fertility, reviewed that options for management of fibroids would not be considerations given that she desires pregnancy, recommended she achieve pregnancy (or speak with REI) and then return for fibroid management  2. Pelvic pain  3. Infertility counseling Reviewed that age 89, fertility is declining and if she desires fertility or future children, she should present to REI soon, she verbalizes understanding and is agreeable With h/o BTL, reviewed IVF briefly with patient - Ambulatory referral to Infertility  4. Screening for breast cancer - MM SCREENING BREAST TOMO BILATERAL; Future   To call Dr. Kerin Perna for fertility counseling  Routine preventative health maintenance measures emphasized. Please refer to After Visit Summary for other counseling recommendations.     Feliz Beam, M.D. Attending [place], [facility] for [employer], [place]

## 2016-11-23 NOTE — Patient Instructions (Signed)
Call Oris Drone for fertility follow up [phone]

## 2016-11-30 DIAGNOSIS — E785 Hyperlipidemia, unspecified: Secondary | ICD-10-CM | POA: Diagnosis not present

## 2016-11-30 DIAGNOSIS — M797 Fibromyalgia: Secondary | ICD-10-CM | POA: Diagnosis not present

## 2016-12-09 ENCOUNTER — Ambulatory Visit: Payer: Medicare Other | Admitting: Adult Health

## 2016-12-10 ENCOUNTER — Encounter: Payer: Self-pay | Admitting: Acute Care

## 2016-12-10 ENCOUNTER — Ambulatory Visit (INDEPENDENT_AMBULATORY_CARE_PROVIDER_SITE_OTHER): Payer: Medicare Other | Admitting: Acute Care

## 2016-12-10 DIAGNOSIS — G4733 Obstructive sleep apnea (adult) (pediatric): Secondary | ICD-10-CM

## 2016-12-10 NOTE — Patient Instructions (Signed)
It is nice to meet you today. Continue on CPAP at bedtime. You appear to be benefiting from the treatment Goal is to wear for at least 6 hours each night for maximal clinical benefit. Continue to work on weight loss, as the link between excess weight  and sleep apnea is well established.  Do not drive if sleepy. Remember to clean mask, tubing, filter, and reservoir once weekly with soapy water.  Follow up with Dr. Elsworth Soho or NP in 6 months  or before as needed. Please contact office for sooner follow up if symptoms do not improve or worsen or seek emergency care

## 2016-12-10 NOTE — Progress Notes (Addendum)
History of Present Illness Joanna Allen is a 41 y.o. obese  female never smoker with  fibromyalgia seen  for evaluation of obstructive sleep apnea by Dr. Elsworth Soho 10/28/2016.She was started on CPAP therapy.  12/10/2016 Follow up after initiation of CPAP therapy: Pt. Was seen by Dr. Elsworth Soho 10/2016 ( Epworth Sleepiness score was 14) and after review of sleep study and CPAP titration recommendation was made for CPAP therapy as follows. CPAP 11 cm H2O with a Medium size Resmed Full Face Mask AirFit F20 mask and heated humidification. Pt returns today for evaluation of effectiveness of CPAP therapy and compliance.Pt. States she is doing well. She states she feels she is sleeping better. She feels her sleep is better quality with CPAP.She states she feels more rested when she awakens.  She states she has noted she does break out around the mask and at times her skin gets dry. She did have a mask fitting with Lynnae Sandhoff. She does not have an issue with leaks. She states she has no issues with equipment or supplies. She states she is wearing her CPAP almost every night. She denies fever, chest pain, orthopnea or hemoptysis.  Down Load: 11/09/2016-12/08/2016 Air Sense 10 AutoSet for Her Set pressure of 11 cm H2O Usage 27/30 days ( 90%) > 4 hours 27/90 days ( 90%) Average nightly usage>> 6 hours 36 minutes AHI = 0.3  Central 0.1, Obstructive 0.1   Test Results: Date of NPSG, Split Night or HST:  06/26/16-  AHI 12.4/ hr, desaturation to 78%, body weight 285 lbs  CPAP Titration 10/10/2016>> - The optimal PAP pressure was 11 cm of water. - Central sleep apnea was not noted during this titration (CAI =  0.0/h). - Moderate oxygen desaturations were observed during this  titration (min O2 = 84.00%). - No snoring was audible during this study. - No cardiac abnormalities were observed during this study. - Clinically significant periodic limb movements were not noted  during this study. Arousals associated with  PLMs were rare.  RECOMMENDATIONS - Trial of CPAP therapy on 11 cm H2O with a Medium size Resmed  Full Face Mask AirFit F20 mask and heated humidification.   CBC Latest Ref Rng & Units 10/04/2016 03/13/2015  WBC 4.0 - 10.5 K/uL 5.7 5.1  Hemoglobin 12.0 - 15.0 g/dL 11.8(L) 12.6  Hematocrit 36.0 - 46.0 % 36.3 37.9  Platelets 150 - 400 K/uL 275 289    BMP Latest Ref Rng & Units 03/13/2015  Glucose 65 - 99 mg/dL 84  BUN 6 - 20 mg/dL 8  Creatinine 0.44 - 1.00 mg/dL 0.91  Sodium 135 - 145 mmol/L 139  Potassium 3.5 - 5.1 mmol/L 3.5  Chloride 101 - 111 mmol/L 105  CO2 22 - 32 mmol/L 23  Calcium 8.9 - 10.3 mg/dL 9.6     Past medical hx Past Medical History:  Diagnosis Date  . Bronchitis   . Fibromyalgia   . Hyperlipidemia      Social History   Tobacco Use  . Smoking status: Never Smoker  . Smokeless tobacco: Never Used  Substance Use Topics  . Alcohol use: Yes    Comment: occasional  . Drug use: No    Ms.Hersman reports that  has never smoked. she has never used smokeless tobacco. She reports that she drinks alcohol. She reports that she does not use drugs.  Tobacco Cessation: Never smoker  Past surgical hx, Family hx, Social hx all reviewed.  Current Outpatient Medications on File Prior to Visit  Medication Sig  . acetaminophen (TYLENOL) 500 MG tablet Take 1,000 mg by mouth every 6 (six) hours as needed for mild pain.  Marland Kitchen albuterol (PROVENTIL HFA;VENTOLIN HFA) 108 (90 Base) MCG/ACT inhaler Inhale into the lungs every 6 (six) hours as needed for wheezing or shortness of breath.  Marland Kitchen atorvastatin (LIPITOR) 40 MG tablet Take 40 mg by mouth daily.  . cetirizine (ZYRTEC) 10 MG tablet Take 10 mg by mouth daily.  . cyclobenzaprine (FLEXERIL) 10 MG tablet Take 10 mg by mouth 3 (three) times daily as needed for muscle spasms.  . diclofenac (VOLTAREN) 75 MG EC tablet   . ergocalciferol (VITAMIN D2) 50000 units capsule Take 50,000 Units by mouth once a week.  Marland Kitchen ibuprofen  (ADVIL,MOTRIN) 600 MG tablet Take 1 tablet (600 mg total) by mouth every 6 (six) hours as needed.  . metroNIDAZOLE (FLAGYL) 500 MG tablet Take 1 tablet (500 mg total) by mouth 2 (two) times daily.  Gean Birchwood ER 100 MG 12 hr tablet   . phentermine 37.5 MG capsule Take 37.5 mg by mouth every morning.  . pregabalin (LYRICA) 50 MG capsule Take 50 mg by mouth 2 (two) times daily.  . ranitidine (ZANTAC) 300 MG tablet Take 300 mg by mouth at bedtime.  . SUMAtriptan (IMITREX) 50 MG tablet Take 50 mg by mouth once. May repeat in 2 hours if headache persists or recurs.  . Vitamin D, Ergocalciferol, (DRISDOL) 50000 units CAPS capsule Take 1 capsule by mouth once a week. Saturday   No current facility-administered medications on file prior to visit.      Allergies  Allergen Reactions  . Amoxicillin Hives    Review Of Systems:  Constitutional:   No  weight loss, night sweats,  Fevers, chills, fatigue, or  lassitude.  HEENT:   No headaches,  Difficulty swallowing,  Tooth/dental problems, or  Sore throat,                No sneezing, itching, ear ache, nasal congestion, post nasal drip,   CV:  No chest pain,  Orthopnea, PND, swelling in lower extremities, anasarca, dizziness, palpitations, syncope.   GI  No heartburn, indigestion, abdominal pain, nausea, vomiting, diarrhea, change in bowel habits, loss of appetite, bloody stools.   Resp: No shortness of breath with exertion or at rest.  No excess mucus, no productive cough,  No non-productive cough,  No coughing up of blood.  No change in color of mucus.  No wheezing.  No chest wall deformity  Skin: no rash or lesions.  GU: no dysuria, change in color of urine, no urgency or frequency.  No flank pain, no hematuria   MS:  No joint pain or swelling.  No decreased range of motion.  No back pain.  Psych:  No change in mood or affect. No depression or anxiety.  No memory loss.   Vital Signs BP 124/62 (BP Location: Left Arm, Cuff Size: Normal)    Pulse 78   Ht 5\' 7"  (1.702 m)   Wt 282 lb 3.2 oz (128 kg)   SpO2 99%   BMI 44.20 kg/m    Physical Exam:  General- No distress,  A&Ox3, pleasant ENT: No sinus tenderness, TM clear, pale nasal mucosa, no oral exudate,no post nasal drip, no LAN Cardiac: S1, S2, regular rate and rhythm, no murmur Chest: No wheeze/ rales/ dullness; no accessory muscle use, no nasal flaring, no sternal retractions Abd.: Soft Non-tender, non-tender, obese Ext: No clubbing cyanosis, edema Neuro:  normal strength  Skin: No rashes, warm and dry Psych: normal mood and behavior, lethargic   Assessment/Plan  OSA (obstructive sleep apnea) Compliant with therapy Set pressure 11 cm H2O AHI 0.3 Clearly benefiting from Treatment Plan: Continue on CPAP at bedtime. You appear to be benefiting from the treatment Goal is to wear for at least 6 hours each night for maximal clinical benefit. Continue to work on weight loss, as the link between excess weight  and sleep apnea is well established.  Do not drive if sleepy. Remember to clean mask, tubing, filter, and reservoir once weekly with soapy water.  Follow up with Dr. Elsworth Soho or NP in 6 months  or before as needed. Please contact office for sooner follow up if symptoms do not improve or worsen or seek emergency care     Continue to work on weight loss, increase exercise as able  Magdalen Spatz, NP 12/10/2016  9:58 AM

## 2016-12-10 NOTE — Assessment & Plan Note (Signed)
Compliant with therapy Set pressure 11 cm H2O AHI 0.3 Clearly benefiting from Treatment Plan: Continue on CPAP at bedtime. You appear to be benefiting from the treatment Goal is to wear for at least 6 hours each night for maximal clinical benefit. Continue to work on weight loss, as the link between excess weight  and sleep apnea is well established.  Do not drive if sleepy. Remember to clean mask, tubing, filter, and reservoir once weekly with soapy water.  Follow up with Dr. Elsworth Soho or NP in 6 months  or before as needed. Please contact office for sooner follow up if symptoms do not improve or worsen or seek emergency care

## 2016-12-12 DIAGNOSIS — G4733 Obstructive sleep apnea (adult) (pediatric): Secondary | ICD-10-CM | POA: Diagnosis not present

## 2016-12-17 DIAGNOSIS — R413 Other amnesia: Secondary | ICD-10-CM | POA: Diagnosis not present

## 2016-12-23 DIAGNOSIS — I10 Essential (primary) hypertension: Secondary | ICD-10-CM | POA: Diagnosis not present

## 2016-12-23 DIAGNOSIS — M797 Fibromyalgia: Secondary | ICD-10-CM | POA: Diagnosis not present

## 2016-12-23 DIAGNOSIS — M25552 Pain in left hip: Secondary | ICD-10-CM | POA: Diagnosis not present

## 2016-12-23 DIAGNOSIS — E785 Hyperlipidemia, unspecified: Secondary | ICD-10-CM | POA: Diagnosis not present

## 2016-12-23 DIAGNOSIS — K219 Gastro-esophageal reflux disease without esophagitis: Secondary | ICD-10-CM | POA: Diagnosis not present

## 2017-01-01 DIAGNOSIS — R413 Other amnesia: Secondary | ICD-10-CM | POA: Diagnosis not present

## 2017-01-12 DIAGNOSIS — G4733 Obstructive sleep apnea (adult) (pediatric): Secondary | ICD-10-CM | POA: Diagnosis not present

## 2017-02-12 DIAGNOSIS — G4733 Obstructive sleep apnea (adult) (pediatric): Secondary | ICD-10-CM | POA: Diagnosis not present

## 2017-02-16 DIAGNOSIS — R413 Other amnesia: Secondary | ICD-10-CM | POA: Diagnosis not present

## 2017-02-22 DIAGNOSIS — G4733 Obstructive sleep apnea (adult) (pediatric): Secondary | ICD-10-CM | POA: Diagnosis not present

## 2017-02-24 DIAGNOSIS — R05 Cough: Secondary | ICD-10-CM | POA: Diagnosis not present

## 2017-02-24 DIAGNOSIS — K219 Gastro-esophageal reflux disease without esophagitis: Secondary | ICD-10-CM | POA: Diagnosis not present

## 2017-02-24 DIAGNOSIS — I10 Essential (primary) hypertension: Secondary | ICD-10-CM | POA: Diagnosis not present

## 2017-02-24 DIAGNOSIS — M797 Fibromyalgia: Secondary | ICD-10-CM | POA: Diagnosis not present

## 2017-02-24 DIAGNOSIS — E785 Hyperlipidemia, unspecified: Secondary | ICD-10-CM | POA: Diagnosis not present

## 2017-03-12 DIAGNOSIS — G4733 Obstructive sleep apnea (adult) (pediatric): Secondary | ICD-10-CM | POA: Diagnosis not present

## 2017-04-01 DIAGNOSIS — M25552 Pain in left hip: Secondary | ICD-10-CM | POA: Diagnosis not present

## 2017-04-02 DIAGNOSIS — E785 Hyperlipidemia, unspecified: Secondary | ICD-10-CM | POA: Diagnosis not present

## 2017-04-02 DIAGNOSIS — Z0000 Encounter for general adult medical examination without abnormal findings: Secondary | ICD-10-CM | POA: Diagnosis not present

## 2017-04-02 DIAGNOSIS — M797 Fibromyalgia: Secondary | ICD-10-CM | POA: Diagnosis not present

## 2017-04-02 DIAGNOSIS — Z0110 Encounter for examination of ears and hearing without abnormal findings: Secondary | ICD-10-CM | POA: Diagnosis not present

## 2017-04-02 DIAGNOSIS — K219 Gastro-esophageal reflux disease without esophagitis: Secondary | ICD-10-CM | POA: Diagnosis not present

## 2017-04-02 DIAGNOSIS — E559 Vitamin D deficiency, unspecified: Secondary | ICD-10-CM | POA: Diagnosis not present

## 2017-04-02 DIAGNOSIS — I10 Essential (primary) hypertension: Secondary | ICD-10-CM | POA: Diagnosis not present

## 2017-04-02 DIAGNOSIS — Z0101 Encounter for examination of eyes and vision with abnormal findings: Secondary | ICD-10-CM | POA: Diagnosis not present

## 2017-04-12 DIAGNOSIS — G4733 Obstructive sleep apnea (adult) (pediatric): Secondary | ICD-10-CM | POA: Diagnosis not present

## 2017-04-19 ENCOUNTER — Encounter: Payer: Self-pay | Admitting: Skilled Nursing Facility1

## 2017-04-19 ENCOUNTER — Encounter: Payer: Medicare Other | Attending: Internal Medicine | Admitting: Skilled Nursing Facility1

## 2017-04-19 DIAGNOSIS — K219 Gastro-esophageal reflux disease without esophagitis: Secondary | ICD-10-CM | POA: Diagnosis not present

## 2017-04-19 DIAGNOSIS — Z6841 Body Mass Index (BMI) 40.0 and over, adult: Secondary | ICD-10-CM | POA: Diagnosis not present

## 2017-04-19 DIAGNOSIS — I119 Hypertensive heart disease without heart failure: Secondary | ICD-10-CM | POA: Insufficient documentation

## 2017-04-19 DIAGNOSIS — K5900 Constipation, unspecified: Secondary | ICD-10-CM | POA: Insufficient documentation

## 2017-04-19 DIAGNOSIS — Z713 Dietary counseling and surveillance: Secondary | ICD-10-CM | POA: Insufficient documentation

## 2017-04-19 DIAGNOSIS — G4700 Insomnia, unspecified: Secondary | ICD-10-CM | POA: Insufficient documentation

## 2017-04-19 DIAGNOSIS — E782 Mixed hyperlipidemia: Secondary | ICD-10-CM | POA: Diagnosis not present

## 2017-04-19 NOTE — Progress Notes (Signed)
  Assessment:  Primary concerns today: obesity/GERD/Constipation.  Pt states she has been taking phentermine on and off for many years. Pt states she wears her C-PAP every night. Pt states she has GERD for multiple years feeling it is managed with medication. Pt has had a hernia repair. Pt states she needs more structure with when to eat. Pt states her last child is about to graduate from high school. Pt states she wakes up at 6-6:30am out for the house at 7:45am. Pt states she stays awake for 72 hours at 5am her body shuts down sleeping 30 minutes to an hour. Pt states she travels to [place] every month to see her mother. Pt states she is working on her insomnia with her doctor. 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 Pt states she has fibrormyalgia stay home flarups occurring 1 every other week.  MEDICATIONS: see list; coq10, fish oil, B6   DIETARY INTAKE:  Usual eating pattern includes 1 meals and 3 snacks per day.  Everyday foods include none stated.  Avoided foods include none stated.    24-hr recall:  B ( AM): skipped Snk ( AM): candy L (1-2 PM): fast food Snk ( PM): candy D ( PM):  Snk ( PM): Beverages: water, soda, lemonade  Usual physical activity: ADL's  Estimated energy needs: 1500 calories 170 g carbohydrates 112 g protein 42 g fat  Progress Towards Goal(s):  In progress.    Intervention:  Nutrition counseling for scheduled eating of balanced meals. Goals: Eat every 3-5 hours: use meal ideas sheet Talk to your doctor about Stopping the phentermine: constipation, dry mouth, no desired results, constipation, insomnia, already a lack of appetite  Aim for 64 fluid oucnes Chocolate exacerbates GERD and 1 meal a day exacerbates GERD Teaching Method Utilized:  Visual Auditory Hands on  Handouts given during visit include:  Meal ideas  Snack ideas  Barriers to learning/adherence to lifestyle change: none  identified   Demonstrated degree of understanding via:  Teach Back   Monitoring/Evaluation:  Dietary intake, exercise, and body weight prn.

## 2017-05-05 ENCOUNTER — Ambulatory Visit: Payer: Medicare Other | Admitting: Advanced Practice Midwife

## 2017-05-12 DIAGNOSIS — G4733 Obstructive sleep apnea (adult) (pediatric): Secondary | ICD-10-CM | POA: Diagnosis not present

## 2017-06-03 ENCOUNTER — Ambulatory Visit: Payer: Medicare Other | Admitting: Skilled Nursing Facility1

## 2017-06-03 DIAGNOSIS — R51 Headache: Secondary | ICD-10-CM | POA: Diagnosis not present

## 2017-06-03 DIAGNOSIS — G4733 Obstructive sleep apnea (adult) (pediatric): Secondary | ICD-10-CM | POA: Diagnosis not present

## 2017-06-03 DIAGNOSIS — Z0000 Encounter for general adult medical examination without abnormal findings: Secondary | ICD-10-CM | POA: Diagnosis not present

## 2017-06-03 DIAGNOSIS — K219 Gastro-esophageal reflux disease without esophagitis: Secondary | ICD-10-CM | POA: Diagnosis not present

## 2017-06-03 DIAGNOSIS — E785 Hyperlipidemia, unspecified: Secondary | ICD-10-CM | POA: Diagnosis not present

## 2017-06-03 DIAGNOSIS — R7303 Prediabetes: Secondary | ICD-10-CM | POA: Diagnosis not present

## 2017-06-03 DIAGNOSIS — I10 Essential (primary) hypertension: Secondary | ICD-10-CM | POA: Diagnosis not present

## 2017-06-11 ENCOUNTER — Ambulatory Visit: Payer: Medicare Other | Admitting: Adult Health

## 2017-06-12 DIAGNOSIS — G4733 Obstructive sleep apnea (adult) (pediatric): Secondary | ICD-10-CM | POA: Diagnosis not present

## 2017-06-15 ENCOUNTER — Ambulatory Visit: Payer: Medicare Other | Admitting: Adult Health

## 2017-07-12 DIAGNOSIS — G4733 Obstructive sleep apnea (adult) (pediatric): Secondary | ICD-10-CM | POA: Diagnosis not present

## 2017-07-17 DIAGNOSIS — Z1231 Encounter for screening mammogram for malignant neoplasm of breast: Secondary | ICD-10-CM | POA: Diagnosis not present

## 2017-07-19 ENCOUNTER — Ambulatory Visit: Payer: Medicare Other | Admitting: Adult Health

## 2017-07-27 ENCOUNTER — Encounter: Payer: Self-pay | Admitting: Adult Health

## 2017-07-27 ENCOUNTER — Other Ambulatory Visit: Payer: Self-pay

## 2017-07-27 ENCOUNTER — Ambulatory Visit (INDEPENDENT_AMBULATORY_CARE_PROVIDER_SITE_OTHER): Payer: Medicare Other | Admitting: Adult Health

## 2017-07-27 VITALS — BP 134/84 | HR 82 | Ht 67.0 in | Wt 284.0 lb

## 2017-07-27 DIAGNOSIS — G4733 Obstructive sleep apnea (adult) (pediatric): Secondary | ICD-10-CM | POA: Diagnosis not present

## 2017-07-27 NOTE — Assessment & Plan Note (Signed)
Wt loss  

## 2017-07-27 NOTE — Patient Outreach (Signed)
[facility]) Care Management  07/27/2017  Maleiya Pergola 1975-09-03 [id number]   Medication Adherence call to Mrs. Elwyn Lade spoke with patient she is still taking  atorvastatin 80 mg she ask if we can contact her doctor for a 90 days supply, left a message at [place] office they said  They will call in a new prescription for a 90 days supply  to [place]. call patient back to let her know doctor will call in her medication to [place]. Mrs. Barcellos is showing past due under [place].  [place] Management Direct Dial [phone]  Fax [phone] [email or web address]

## 2017-07-27 NOTE — Patient Instructions (Signed)
Continue on CPAP at bedtime Try to wear CPAP each night for at least 4 to 6 hours Work on healthy weight Do not drive if sleepy .  Follow-up with Dr. Elsworth Soho in 1 year and as needed

## 2017-07-27 NOTE — Assessment & Plan Note (Signed)
Good control on CPAP Encouraged on compliance   Plan  Patient Instructions  Continue on CPAP at bedtime Try to wear CPAP each night for at least 4 to 6 hours Work on healthy weight Do not drive if sleepy .  Follow-up with Dr. Elsworth Soho in 1 year and as needed

## 2017-07-27 NOTE — Progress Notes (Signed)
@Patient  ID: Joanna Allen, female    DOB: 11-14-1975, 42 y.o.   MRN: [id number]  Chief Complaint  Patient presents with  . Follow-up    OSA     Referring provider: Benito Mccreedy, MD  HPI: 42 year old female followed for mild obstructive sleep apnea. Past medical history significant for obesity, fibromyalgia and migraine headaches  TEST  Significant tests/ events reviewed  PSG 06/2016 showed mild OSA with AHI 12/hour with moderate oxygen desaturation, lowest 78% CPAP titration 09/2016>> 11 cm, medium fullface mask   07/27/2017 Follow up : OSA  Patient presents for a six-month follow-up.  Patient has underlying mild sleep apnea.  She is on CPAP at bedtime.  Patient says she tries to wear it most nights.  She does forget it sometimes when she travels.  Her mother has been sick and she is been out of town quite a bit.  She says that she has chronic fatigue with her fibromyalgia.  So feels tired a lot.  CPAP download shows 73% compliance.  Average usage at 6 hours.  Patient is on CPAP 11 cm H2O.  AHI is 0.4.  Minimal leaks. She does state that she is not crazy about her current mask.  It feels that it is too tight at times. Patient does take daily medications with Lyrica, Nucynta and Flexeril. Allergies  Allergen Reactions  . Amoxicillin Hives     There is no immunization history on file for this patient.  Past Medical History:  Diagnosis Date  . Bronchitis   . Fibromyalgia   . Hyperlipidemia     Tobacco History: Social History   Tobacco Use  Smoking Status Never Smoker  Smokeless Tobacco Never Used   Counseling given: Not Answered   Outpatient Medications Prior to Visit  Medication Sig Dispense Refill  . acetaminophen (TYLENOL) 500 MG tablet Take 1,000 mg by mouth every 6 (six) hours as needed for mild pain.    Marland Kitchen albuterol (PROVENTIL HFA;VENTOLIN HFA) 108 (90 Base) MCG/ACT inhaler Inhale into the lungs every 6 (six) hours as needed for wheezing or shortness  of breath.    Marland Kitchen atorvastatin (LIPITOR) 40 MG tablet Take 40 mg by mouth daily.    . cetirizine (ZYRTEC) 10 MG tablet Take 10 mg by mouth daily.  0  . cyclobenzaprine (FLEXERIL) 10 MG tablet Take 10 mg by mouth 3 (three) times daily as needed for muscle spasms.    . diclofenac (VOLTAREN) 75 MG EC tablet     . ergocalciferol (VITAMIN D2) 50000 units capsule Take 50,000 Units by mouth once a week.    Marland Kitchen ibuprofen (ADVIL,MOTRIN) 600 MG tablet Take 1 tablet (600 mg total) by mouth every 6 (six) hours as needed. 30 tablet 0  . NUCYNTA ER 100 MG 12 hr tablet   0  . phentermine 37.5 MG capsule Take 37.5 mg by mouth every morning.    . pregabalin (LYRICA) 50 MG capsule Take 50 mg by mouth 2 (two) times daily.    . ranitidine (ZANTAC) 300 MG tablet Take 300 mg by mouth at bedtime.  0  . Vitamin D, Ergocalciferol, (DRISDOL) 50000 units CAPS capsule Take 1 capsule by mouth once a week. Saturday  0  . metroNIDAZOLE (FLAGYL) 500 MG tablet Take 1 tablet (500 mg total) by mouth 2 (two) times daily. (Patient not taking: Reported on 07/27/2017) 14 tablet 0  . SUMAtriptan (IMITREX) 50 MG tablet Take 50 mg by mouth once. May repeat in 2 hours if headache persists or  recurs.     No facility-administered medications prior to visit.      Review of Systems  Constitutional:   No  weight loss, night sweats,  Fevers, chills +, fatigue, or  lassitude.  HEENT:   No headaches,  Difficulty swallowing,  Tooth/dental problems, or  Sore throat,                No sneezing, itching, ear ache, nasal congestion, post nasal drip,   CV:  No chest pain,  Orthopnea, PND, swelling in lower extremities, anasarca, dizziness, palpitations, syncope.   GI  No heartburn, indigestion, abdominal pain, nausea, vomiting, diarrhea, change in bowel habits, loss of appetite, bloody stools.   Resp: No shortness of breath with exertion or at rest.  No excess mucus, no productive cough,  No non-productive cough,  No coughing up of blood.  No  change in color of mucus.  No wheezing.  No chest wall deformity  Skin: no rash or lesions.  GU: no dysuria, change in color of urine, no urgency or frequency.  No flank pain, no hematuria   MS:  No joint pain or swelling.  No decreased range of motion.  No back pain.    Physical Exam  BP 134/84 (BP Location: Right Arm, Cuff Size: Normal)   Pulse 82   Ht 5\' 7"  (1.702 m)   Wt 284 lb (128.8 kg)   SpO2 95%   BMI 44.48 kg/m   GEN: A/Ox3; pleasant , NAD, obese    HEENT:  [place]/AT,  EACs-clear, TMs-wnl, NOSE-clear, THROAT-clear, no lesions, no postnasal drip or exudate noted. Class 3 MP airway   NECK:  Supple w/ fair ROM; no JVD; normal carotid impulses w/o bruits; no thyromegaly or nodules palpated; no lymphadenopathy.    RESP  Clear  P & A; w/o, wheezes/ rales/ or rhonchi. no accessory muscle use, no dullness to percussion  CARD:  RRR, no m/r/g, no peripheral edema, pulses intact, no cyanosis or clubbing.  GI:   Soft & nt; nml bowel sounds; no organomegaly or masses detected.   Musco: Warm bil, no deformities or joint swelling noted.   Neuro: alert, no focal deficits noted.    Skin: Warm, no lesions or rashes    Lab Results:  CBC  BNP No results found for: BNP  ProBNP No results found for: PROBNP  Imaging: No results found.   Assessment & Plan:   OSA (obstructive sleep apnea) Good control on CPAP Encouraged on compliance   Plan  Patient Instructions  Continue on CPAP at bedtime Try to wear CPAP each night for at least 4 to 6 hours Work on healthy weight Do not drive if sleepy .  Follow-up with Dr. Elsworth Soho in 1 year and as needed     Morbid obesity [facility]) Wt loss      Rexene Edison, NP 07/27/2017

## 2017-08-12 DIAGNOSIS — G4733 Obstructive sleep apnea (adult) (pediatric): Secondary | ICD-10-CM | POA: Diagnosis not present

## 2017-09-15 DIAGNOSIS — M797 Fibromyalgia: Secondary | ICD-10-CM | POA: Diagnosis not present

## 2017-09-15 DIAGNOSIS — E785 Hyperlipidemia, unspecified: Secondary | ICD-10-CM | POA: Diagnosis not present

## 2017-09-15 DIAGNOSIS — G4733 Obstructive sleep apnea (adult) (pediatric): Secondary | ICD-10-CM | POA: Diagnosis not present

## 2017-10-13 DIAGNOSIS — E785 Hyperlipidemia, unspecified: Secondary | ICD-10-CM | POA: Diagnosis not present

## 2017-10-13 DIAGNOSIS — M797 Fibromyalgia: Secondary | ICD-10-CM | POA: Diagnosis not present

## 2017-10-15 ENCOUNTER — Other Ambulatory Visit: Payer: Self-pay | Admitting: Pharmacist

## 2017-10-15 NOTE — Patient Outreach (Signed)
[facility]) Care Management  10/15/2017  Xiana Carns August 17, 1975 [id number]   Outreach call to Elwyn Lade regarding her request for follow up from the [facility] Medication Adherence Campaign regarding her atorvastatin. Left a HIPAA compliant message on the patient's voicemail.   Harlow Asa, PharmD, [place] Management [phone]

## 2017-11-24 DIAGNOSIS — E785 Hyperlipidemia, unspecified: Secondary | ICD-10-CM | POA: Diagnosis not present

## 2017-11-24 DIAGNOSIS — I10 Essential (primary) hypertension: Secondary | ICD-10-CM | POA: Diagnosis not present

## 2017-11-24 DIAGNOSIS — R51 Headache: Secondary | ICD-10-CM | POA: Diagnosis not present

## 2017-11-24 DIAGNOSIS — K219 Gastro-esophageal reflux disease without esophagitis: Secondary | ICD-10-CM | POA: Diagnosis not present

## 2017-11-24 DIAGNOSIS — M797 Fibromyalgia: Secondary | ICD-10-CM | POA: Diagnosis not present

## 2017-12-15 DIAGNOSIS — G4733 Obstructive sleep apnea (adult) (pediatric): Secondary | ICD-10-CM | POA: Diagnosis not present

## 2018-01-17 IMAGING — CT CT ABDOMEN W/ CM
2 of 4 series · 16 of 46 positions shown, 18 images · IV contrast (APPLIED)
Comparison: 03/12/2015 right upper quadrant ultrasound.

CLINICAL DATA: Right upper quadrant pain for 3 months. No history
of primary malignancy.

EXAM:
CT ABDOMEN WITH CONTRAST
TECHNIQUE: Multidetector CT imaging of the abdomen was performed using the
standard protocol following bolus administration of intravenous
contrast.
CONTRAST:  125mL [id number]-[id number] IOPAMIDOL (I8QDK2-KOO) INJECTION 61%

[Series 2: abdroutine 5.0 i31s 1 · axial · 0.72mm/px · z∈[-298,-33]mm · 13 of 59 slices shown, 15 images]
[im 3/59  soft-tissue]
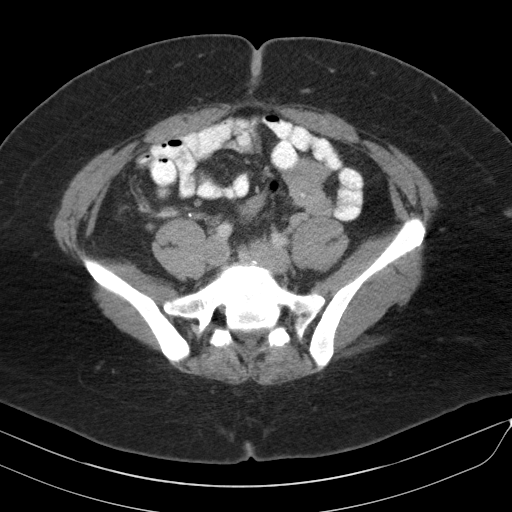
[im 3/59  bone]
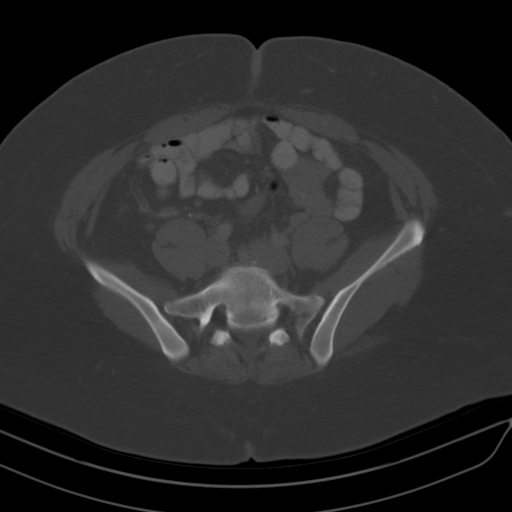
[im 8/59  soft-tissue]
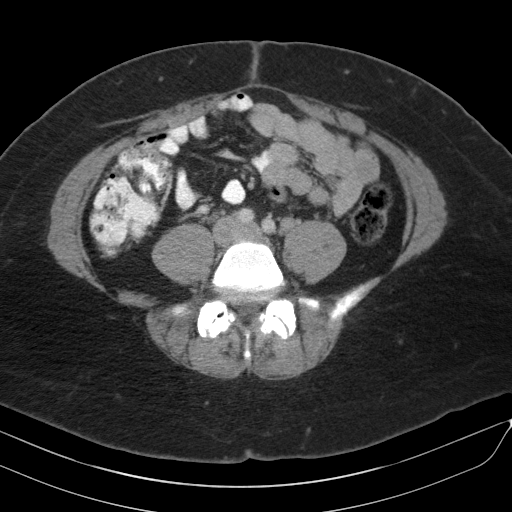
[im 13/59  soft-tissue]
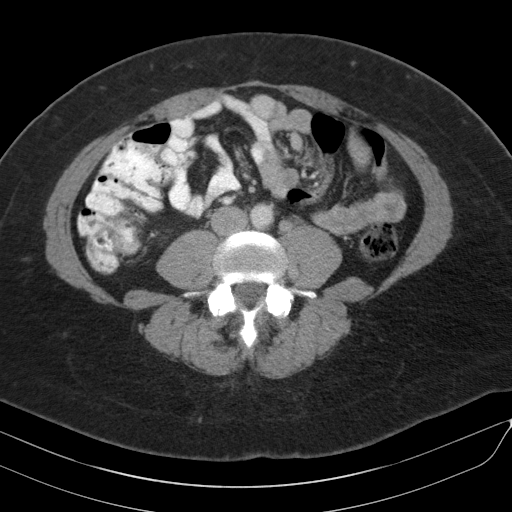
[im 16/59  soft-tissue]
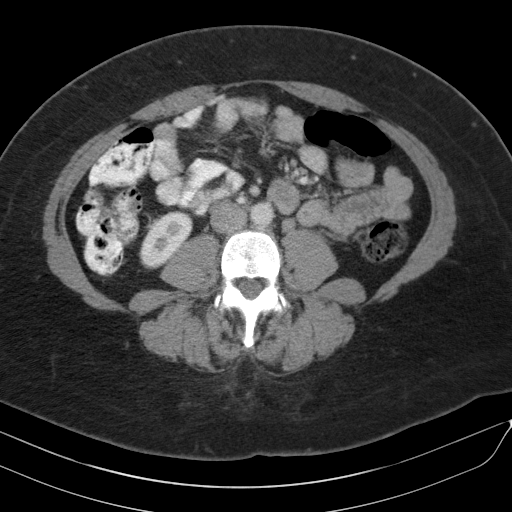
[im 21/59  soft-tissue]
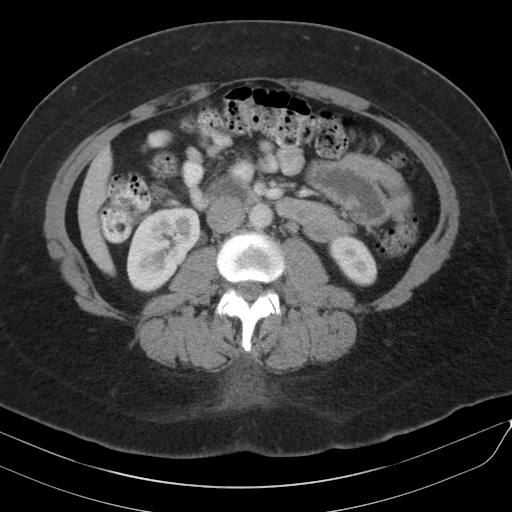
[im 26/59  soft-tissue]
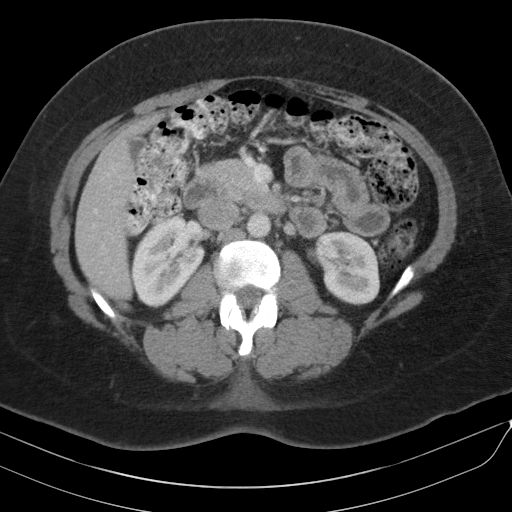
[im 31/59  soft-tissue]
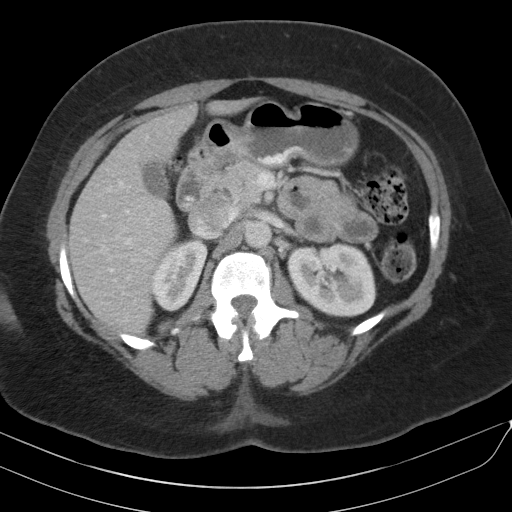
[im 33/59  soft-tissue]
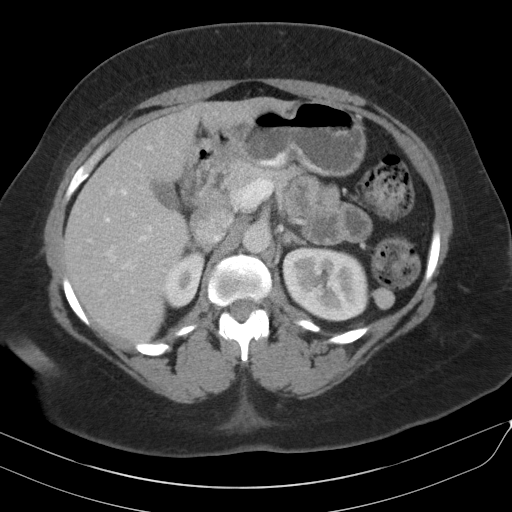
[im 38/59  soft-tissue]
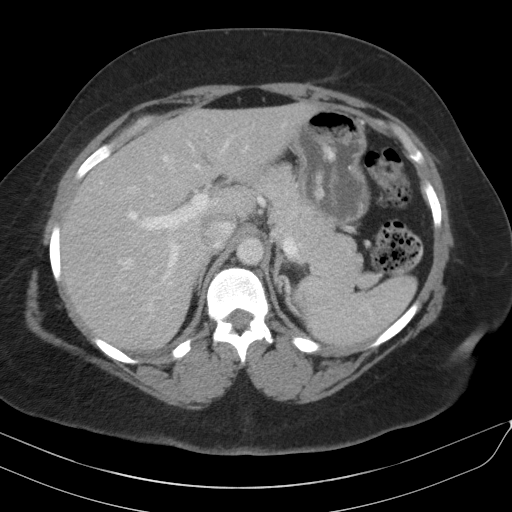
[im 38/59  bone]
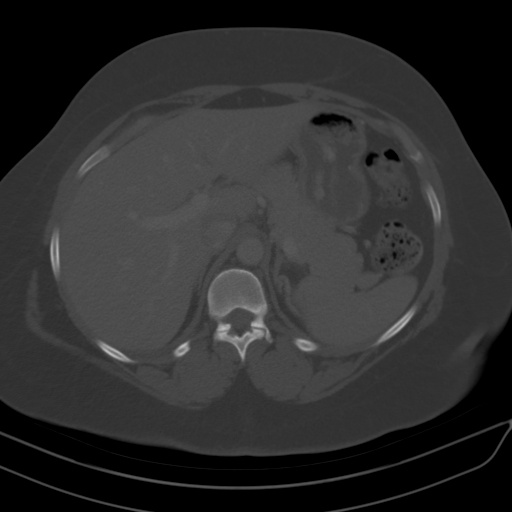
[im 43/59  soft-tissue]
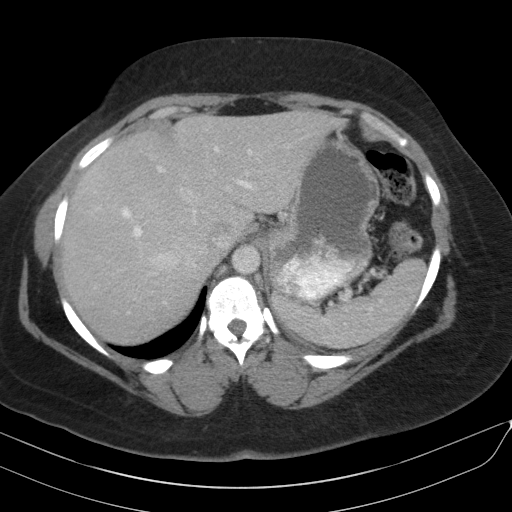
[im 46/59  soft-tissue]
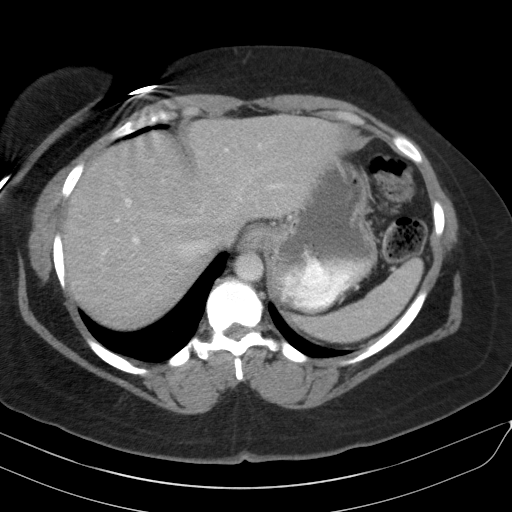
[im 51/59  soft-tissue]
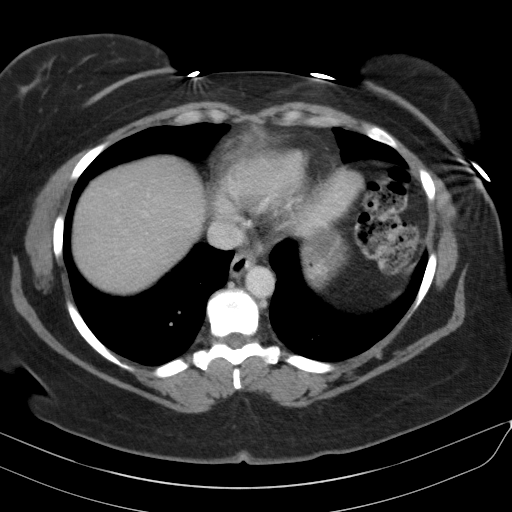
[im 56/59  soft-tissue]
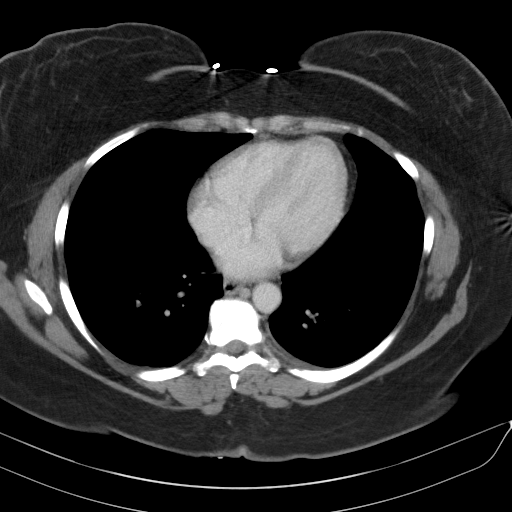

[Series 3: cor abd · coronal · 0.59mm/px · 3 of 106 slices shown]
[im 36/106  soft-tissue]
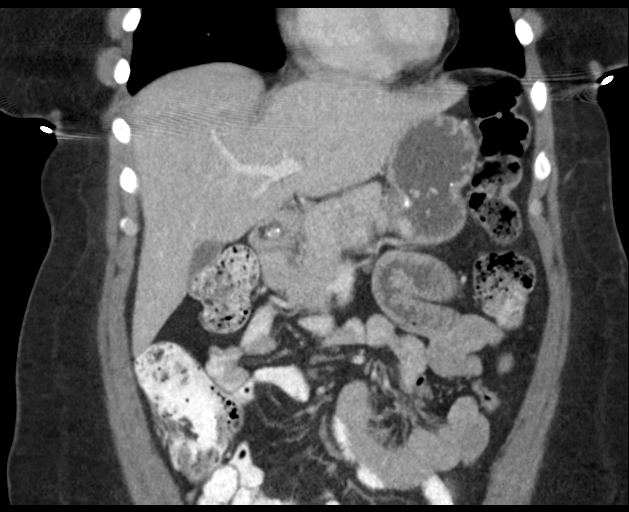
[im 47/106  soft-tissue]
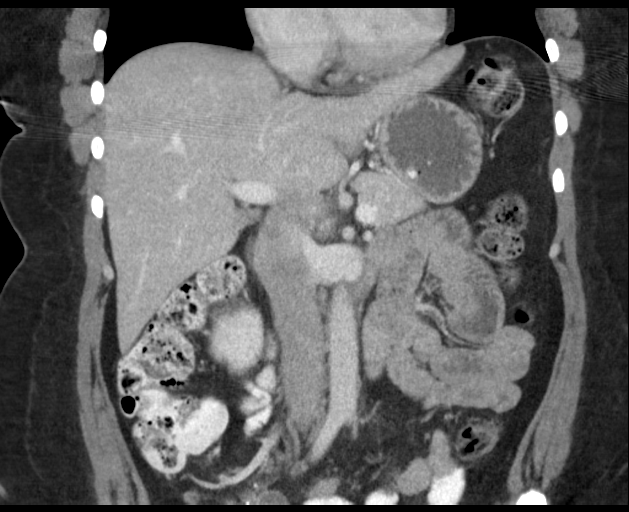
[im 59/106  soft-tissue]
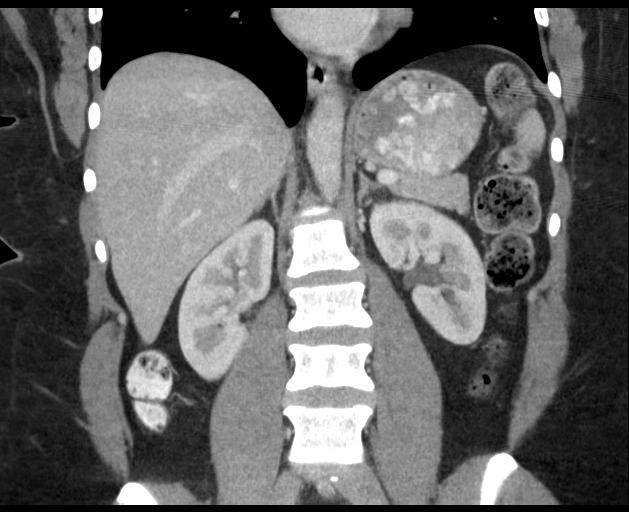

[16 of 46 positions shown; findings below may reference images not displayed]

FINDINGS: Lower chest: Clear lung bases. Normal heart size. Trace left-sided
pleural thickening.

Hepatobiliary: Mild hepatomegaly, 18.2 cm craniocaudal. No focal
liver lesion. Normal gallbladder, without biliary ductal dilatation.

Pancreas: Normal, without mass or ductal dilatation.

Spleen: Normal in size, without focal abnormality.

Adrenals/Urinary Tract: Normal adrenal glands. Normal kidneys,
without hydronephrosis.

Stomach/Bowel: Normal stomach, without wall thickening. Colonic
stool burden suggests constipation. Normal abdominal portions of the
terminal ileum and appendix. Normal abdominal small bowel.

Vascular/Lymphatic: Normal caliber of the aorta and branch vessels.
No retroperitoneal or retrocrural adenopathy.

Other: No ascites.

Musculoskeletal: No acute osseous abnormality.
IMPRESSION: 1.  Possible constipation.
2. Mild hepatomegaly.

## 2018-01-19 DIAGNOSIS — M797 Fibromyalgia: Secondary | ICD-10-CM | POA: Diagnosis not present

## 2018-01-19 DIAGNOSIS — K219 Gastro-esophageal reflux disease without esophagitis: Secondary | ICD-10-CM | POA: Diagnosis not present

## 2018-01-19 DIAGNOSIS — I10 Essential (primary) hypertension: Secondary | ICD-10-CM | POA: Diagnosis not present

## 2018-01-19 DIAGNOSIS — R51 Headache: Secondary | ICD-10-CM | POA: Diagnosis not present

## 2018-01-19 DIAGNOSIS — E785 Hyperlipidemia, unspecified: Secondary | ICD-10-CM | POA: Diagnosis not present

## 2018-02-22 DIAGNOSIS — I10 Essential (primary) hypertension: Secondary | ICD-10-CM | POA: Diagnosis not present

## 2018-02-22 DIAGNOSIS — K219 Gastro-esophageal reflux disease without esophagitis: Secondary | ICD-10-CM | POA: Diagnosis not present

## 2018-02-22 DIAGNOSIS — E785 Hyperlipidemia, unspecified: Secondary | ICD-10-CM | POA: Diagnosis not present

## 2018-02-22 DIAGNOSIS — M797 Fibromyalgia: Secondary | ICD-10-CM | POA: Diagnosis not present

## 2018-02-22 DIAGNOSIS — R51 Headache: Secondary | ICD-10-CM | POA: Diagnosis not present

## 2018-02-28 DIAGNOSIS — N62 Hypertrophy of breast: Secondary | ICD-10-CM | POA: Diagnosis not present

## 2018-03-17 DIAGNOSIS — G4733 Obstructive sleep apnea (adult) (pediatric): Secondary | ICD-10-CM | POA: Diagnosis not present

## 2018-04-06 DIAGNOSIS — Z5181 Encounter for therapeutic drug level monitoring: Secondary | ICD-10-CM | POA: Diagnosis not present

## 2018-04-06 DIAGNOSIS — Z1389 Encounter for screening for other disorder: Secondary | ICD-10-CM | POA: Diagnosis not present

## 2018-04-06 DIAGNOSIS — Z136 Encounter for screening for cardiovascular disorders: Secondary | ICD-10-CM | POA: Diagnosis not present

## 2018-04-06 DIAGNOSIS — Z0000 Encounter for general adult medical examination without abnormal findings: Secondary | ICD-10-CM | POA: Diagnosis not present

## 2018-04-06 DIAGNOSIS — Z01021 Encounter for examination of eyes and vision following failed vision screening with abnormal findings: Secondary | ICD-10-CM | POA: Diagnosis not present

## 2018-04-06 DIAGNOSIS — Z131 Encounter for screening for diabetes mellitus: Secondary | ICD-10-CM | POA: Diagnosis not present

## 2018-04-06 DIAGNOSIS — Z01118 Encounter for examination of ears and hearing with other abnormal findings: Secondary | ICD-10-CM | POA: Diagnosis not present

## 2018-04-06 DIAGNOSIS — G4733 Obstructive sleep apnea (adult) (pediatric): Secondary | ICD-10-CM | POA: Diagnosis not present

## 2018-04-11 DIAGNOSIS — L723 Sebaceous cyst: Secondary | ICD-10-CM | POA: Diagnosis not present

## 2018-04-11 DIAGNOSIS — R51 Headache: Secondary | ICD-10-CM | POA: Diagnosis not present

## 2018-04-11 DIAGNOSIS — I10 Essential (primary) hypertension: Secondary | ICD-10-CM | POA: Diagnosis not present

## 2018-04-11 DIAGNOSIS — K219 Gastro-esophageal reflux disease without esophagitis: Secondary | ICD-10-CM | POA: Diagnosis not present

## 2018-04-11 DIAGNOSIS — M797 Fibromyalgia: Secondary | ICD-10-CM | POA: Diagnosis not present

## 2018-04-20 DIAGNOSIS — G4733 Obstructive sleep apnea (adult) (pediatric): Secondary | ICD-10-CM | POA: Diagnosis not present

## 2018-06-17 ENCOUNTER — Ambulatory Visit (HOSPITAL_BASED_OUTPATIENT_CLINIC_OR_DEPARTMENT_OTHER): Admit: 2018-06-17 | Payer: Medicare Other | Admitting: Plastic Surgery

## 2018-06-17 ENCOUNTER — Encounter (HOSPITAL_BASED_OUTPATIENT_CLINIC_OR_DEPARTMENT_OTHER): Payer: Self-pay

## 2018-06-17 SURGERY — MAMMOPLASTY, REDUCTION
Anesthesia: General | Site: Breast | Laterality: Bilateral

## 2018-07-08 DIAGNOSIS — Z0001 Encounter for general adult medical examination with abnormal findings: Secondary | ICD-10-CM | POA: Diagnosis not present

## 2018-07-08 DIAGNOSIS — R609 Edema, unspecified: Secondary | ICD-10-CM | POA: Diagnosis not present

## 2018-07-15 DIAGNOSIS — G4733 Obstructive sleep apnea (adult) (pediatric): Secondary | ICD-10-CM | POA: Diagnosis not present

## 2018-08-09 DIAGNOSIS — M797 Fibromyalgia: Secondary | ICD-10-CM | POA: Diagnosis not present

## 2018-08-09 DIAGNOSIS — R51 Headache: Secondary | ICD-10-CM | POA: Diagnosis not present

## 2018-08-09 DIAGNOSIS — R05 Cough: Secondary | ICD-10-CM | POA: Diagnosis not present

## 2018-08-09 DIAGNOSIS — I10 Essential (primary) hypertension: Secondary | ICD-10-CM | POA: Diagnosis not present

## 2018-08-09 DIAGNOSIS — K219 Gastro-esophageal reflux disease without esophagitis: Secondary | ICD-10-CM | POA: Diagnosis not present

## 2018-08-18 DIAGNOSIS — G4733 Obstructive sleep apnea (adult) (pediatric): Secondary | ICD-10-CM | POA: Diagnosis not present

## 2018-11-15 DIAGNOSIS — K219 Gastro-esophageal reflux disease without esophagitis: Secondary | ICD-10-CM | POA: Diagnosis not present

## 2018-11-15 DIAGNOSIS — M5442 Lumbago with sciatica, left side: Secondary | ICD-10-CM | POA: Diagnosis not present

## 2018-11-15 DIAGNOSIS — M5441 Lumbago with sciatica, right side: Secondary | ICD-10-CM | POA: Diagnosis not present

## 2018-11-15 DIAGNOSIS — M797 Fibromyalgia: Secondary | ICD-10-CM | POA: Diagnosis not present

## 2018-11-15 DIAGNOSIS — I10 Essential (primary) hypertension: Secondary | ICD-10-CM | POA: Diagnosis not present

## 2018-11-15 DIAGNOSIS — G8929 Other chronic pain: Secondary | ICD-10-CM | POA: Diagnosis not present

## 2018-11-18 DIAGNOSIS — G4733 Obstructive sleep apnea (adult) (pediatric): Secondary | ICD-10-CM | POA: Diagnosis not present

## 2018-12-08 ENCOUNTER — Ambulatory Visit: Payer: Medicare Other | Admitting: Physical Therapy

## 2018-12-20 ENCOUNTER — Other Ambulatory Visit: Payer: Self-pay | Admitting: Sports Medicine

## 2018-12-20 DIAGNOSIS — M545 Low back pain, unspecified: Secondary | ICD-10-CM

## 2018-12-21 ENCOUNTER — Ambulatory Visit: Payer: Medicare Other | Attending: Physician Assistant | Admitting: Physical Therapy

## 2018-12-21 ENCOUNTER — Encounter: Payer: Self-pay | Admitting: Physical Therapy

## 2018-12-21 ENCOUNTER — Other Ambulatory Visit: Payer: Self-pay

## 2018-12-21 DIAGNOSIS — M545 Low back pain, unspecified: Secondary | ICD-10-CM

## 2018-12-21 DIAGNOSIS — G8929 Other chronic pain: Secondary | ICD-10-CM

## 2018-12-21 DIAGNOSIS — M6281 Muscle weakness (generalized): Secondary | ICD-10-CM | POA: Diagnosis not present

## 2018-12-22 NOTE — Therapy (Signed)
[place], [place], [address] Phone: [phone]   Fax:  [phone]  Physical Therapy Evaluation  Patient Details  Name: Joanna Allen MRN: [id number] Date of Birth: 1975/06/15 Referring Provider (PT): Trey Sailors, [place]   Encounter Date: 12/21/2018  PT End of Session - 12/21/18 1610    Visit Number  1    Number of Visits  8    Date for PT Re-Evaluation  02/15/19    Authorization Type  [employer] MCR    PT Start Time  [id number]    PT Stop Time  1615    PT Time Calculation (min)  45 min    Activity Tolerance  Patient tolerated treatment well    Behavior During Therapy  [facility] for tasks assessed/performed       Past Medical History:  Diagnosis Date  . Bronchitis   . Fibromyalgia   . Hyperlipidemia     Past Surgical History:  Procedure Laterality Date  . BUNIONECTOMY  2016  . HERNIA REPAIR    . TUBAL LIGATION      There were no vitals filed for this visit.   Subjective Assessment - 12/21/18 1534    Subjective  Patient reports low back pain for past 20 years. States that no apparent mechanism of injury, she just woke up with back pain. She has been diagnosed with fibromyalgia in 2011. She has only taken medications in the past and they never improved her back pain. She has an MRI scheduled for 01/06/2018.    Pertinent History  Fibromyalgia    Limitations  Sitting;Lifting;Standing;Walking;House hold activities    How long can you sit comfortably?  Varies based on pain level - typically all sitting    How long can you stand comfortably?  Varies based on pain level - typically all standing    How long can you walk comfortably?  Varies based on pain level - typically all walking    Diagnostic tests  X-ray, MRI scheduled 01/07/2019    Patient Stated Goals  Get rid of pain.    Currently in Pain?  Yes    Pain Score  8     Pain Location  Back    Pain Orientation  Lower;Right;Left    Pain Descriptors /  Indicators  Aching;Dull;Sharp;Spasm;Stabbing;Pins and needles    Pain Type  Chronic pain    Pain Radiating Towards  Can radiate down back of both legs if severe, sometimes to the side of the hip    Pain Onset  More than a month ago    Pain Frequency  Constant    Aggravating Factors   Everything    Pain Relieving Factors  Nothing    Effect of Pain on Daily Activities  Patient is limited in all activities due to pain         [facility] PT Assessment - 12/22/18 0001      Assessment   Medical Diagnosis  Chronic low back pain    Referring Provider (PT)  Trey Sailors, PA    Onset Date/Surgical Date  --   patient reports 20 years   Hand Dominance  Right    Next MD Visit  Not scheduled    Prior Therapy  None      Precautions   Precautions  None      Restrictions   Weight Bearing Restrictions  No      Balance Screen   Has the patient fallen in the past 6 months  No    Has the patient had a decrease in activity level because of a fear of falling?   No    Is the patient reluctant to leave their home because of a fear of falling?   No      Home Film/video editor residence    Living Arrangements  Children      Prior Function   Level of Independence  Independent with basic ADLs   occasionally requires assistance if pain is severe   Vocation  On disability      Cognition   Overall Cognitive Status  Within Functional Limits for tasks assessed      Observation/Other Assessments   Observations  Patient appears in no apparent distress    Other Surveys   Modified Oswestry    Modified Oswestry  42      Sensation   Light Touch  Appears Intact      Posture/Postural Control   Posture Comments  Patient exhibits increased lumbar lordosis      ROM / Strength   AROM / PROM / Strength  AROM;Strength      AROM   Overall AROM Comments  Lumbar AROM grossly WFL with increased pain in all ranges      Strength   Overall Strength Comments  Patient exhibits poor  core strength/control with increased low back pain    Strength Assessment Site  Hip;Knee;Ankle    Right/Left Hip  Right;Left    Right Hip Flexion  4/5    Right Hip Extension  4-/5    Right Hip ABduction  4-/5    Left Hip Flexion  4/5    Left Hip Extension  4-/5    Left Hip ABduction  4-/5    Right/Left Knee  Right;Left    Right Knee Flexion  4+/5    Right Knee Extension  5/5    Left Knee Flexion  4+/5    Left Knee Extension  5/5    Right/Left Ankle  Right;Left    Right Ankle Dorsiflexion  5/5    Right Ankle Eversion  5/5    Left Ankle Dorsiflexion  5/5    Left Ankle Eversion  5/5      Palpation   Spinal mobility  Not assessed    Palpation comment  TTP grossly over lower back region with light palpation      Transfers   Transfers  Independent with all Transfers                Objective measurements completed on examination: See above findings.      [place] Adult PT Treatment/Exercise - 12/22/18 0001      Exercises   Exercises  Lumbar      Lumbar Exercises: Seated   Other Seated Lumbar Exercises  Abdominal isometric with hands on knees 5 sec x5 - focus on proper posture and engaging core while breathing      Lumbar Exercises: Supine   Other Supine Lumbar Exercises  Lower trunk rotation x10 - remained within pain free range and cued for core engagement to keep movements slow and controlled    Other Supine Lumbar Exercises  Hooklying hip adduction and abduction isometrics with core engagement 5 sec x5 each - use towel roll for adduction and belt for abduction             PT Education - 12/21/18 1610    Education Details  Exam findings, POC, HEP, graded exercise  Person(s) Educated  Patient    Methods  Explanation;Demonstration;Verbal cues;Handout;Tactile cues    Comprehension  Verbalized understanding;Returned demonstration;Verbal cues required;Tactile cues required;Need further instruction       PT Short Term Goals - 12/22/18 0905      PT SHORT  TERM GOAL #1   Title  Patient will be I in HEP to progress in PT.    Time  4    Period  Weeks    Status  New    Target Date  01/18/19      PT SHORT TERM GOAL #2   Title  Patient will report reduced resting pain level to < or = 6/10    Time  4    Period  Weeks    Status  New    Target Date  01/18/19        PT Long Term Goals - 12/22/18 0909      PT LONG TERM GOAL #1   Title  Patient will report improved resting pain level to < or = 3-4/10 to allow for improved functional ability    Time  8    Period  Weeks    Status  New    Target Date  02/15/19      PT LONG TERM GOAL #2   Title  Patient will exhibit improved core and gluteal strength > or = 4/5 MMT to allow for improved lumbar control with lifting tasks.    Time  8    Period  Weeks    Status  New    Target Date  02/15/19      PT LONG TERM GOAL #3   Title  Patient will report improved functional level of < or = 30% disability on Modified Oswestry to indicate improve ability to sit/stand/perform household tasks.    Time  8    Period  Weeks    Status  New    Target Date  02/15/19             Plan - 12/21/18 1620    Clinical Impression Statement  Patient presents to physical therapy with report of chronic severe low back pain and a history of fibromyalgia. She exhibits good motion but has pain, strength deficit of the core and hips, and increased TTP to light palpation gossly over the lower back. Due to the high severity and irritable nature of her low back pain, exercises focused on control muscular activation with limited motion to avoid exacerbating symptoms. She did tolerate the exercises well and she would benefit from continued skilled PT to progress her tolerance to activity through graded exercises to reduce pain and maximize functional level.    Personal Factors and Comorbidities  Past/Current Experience;Time since onset of injury/illness/exacerbation;Social Background;Fitness;Comorbidity 1    Comorbidities   Fibromyalgia    Examination-Activity Limitations  Bed Mobility;Locomotion Level;Transfers;Sit;Squat;Stairs;Stand;Lift;Hygiene/Grooming;Dressing;Carry;Bend    Examination-Participation Restrictions  Community Activity;Laundry;Yard Work;Meal Prep;Shop    Stability/Clinical Decision Making  Evolving/Moderate complexity    Clinical Decision Making  Moderate    Rehab Potential  Good    PT Frequency  1x / week    PT Duration  8 weeks    PT Treatment/Interventions  Cryotherapy;Electrical Stimulation;Moist Heat;Traction;Neuromuscular re-education;Therapeutic exercise;Therapeutic activities;Patient/family education;Manual techniques;Dry needling;Spinal Manipulations;Joint Manipulations    PT Next Visit Plan  Assess HEP, progress core and hip activation exercise, pain science education as needed    PT Home Exercise Plan  Supine core activation with alternating marching, hooklying core activation with hip adduction towel squeeze isometric,  hooklying core activation with hip abduction isometric, supine LTR, seated core activation with hands on knees    Consulted and Agree with Plan of Care  Patient       Patient will benefit from skilled therapeutic intervention in order to improve the following deficits and impairments:  Decreased endurance, Decreased activity tolerance, Pain, Decreased strength, Postural dysfunction  Visit Diagnosis: Chronic low back pain, unspecified back pain laterality, unspecified whether sciatica present  Muscle weakness (generalized)     Problem List Patient Active Problem List   Diagnosis Date Noted  . Morbid obesity ([place]) 07/27/2017  . OSA (obstructive sleep apnea) 10/28/2016    Hilda Blades, PT, DPT, LAT, ATC 12/22/18  10:13 AM Phone: [phone] Fax: [place] [facility] [address] [place], [place], [address] Phone: [phone]   Fax:  [phone]  Name: Aquila Boguslawski MRN: [id number] Date of  Birth: 07-17-1975

## 2018-12-22 NOTE — Patient Instructions (Signed)
Access Code: [facility]  URL: [email or web address]  Date: 12/22/2018  Prepared by: Hilda Blades   Exercises Seated Piriformis Stretch - 3 reps - 20 seconds hold - 1-2x daily Seated Hip Stretch - 3 reps - 20 seconds hold - 1-2x daily Supine Lower Trunk Rotation - 10 reps - 1-2x daily Hooklying Isometric Hip Abduction with Belt - 10 reps - 5 sec hold hold - 1-2x daily Supine Hip Adduction Isometric with Ball - 10 reps - 5 sec hold hold - 1-2x daily Seated Abdominal Press into [employer] - 10 reps - 5 second hold hold - 1-2x daily

## 2018-12-27 ENCOUNTER — Ambulatory Visit: Payer: Medicare Other | Admitting: Physical Therapy

## 2019-01-03 ENCOUNTER — Ambulatory Visit: Payer: Medicare Other | Admitting: Physical Therapy

## 2019-01-07 ENCOUNTER — Ambulatory Visit
Admission: RE | Admit: 2019-01-07 | Discharge: 2019-01-07 | Disposition: A | Discharge status: Home / Self Care | Payer: Medicare Other | Source: Ambulatory Visit | Attending: Sports Medicine | Admitting: Sports Medicine

## 2019-01-07 ENCOUNTER — Other Ambulatory Visit: Payer: Self-pay

## 2019-01-07 DIAGNOSIS — M545 Low back pain, unspecified: Secondary | ICD-10-CM

## 2019-01-07 DIAGNOSIS — M48061 Spinal stenosis, lumbar region without neurogenic claudication: Secondary | ICD-10-CM | POA: Diagnosis not present

## 2019-01-10 ENCOUNTER — Ambulatory Visit: Payer: Medicare Other | Attending: Physician Assistant | Admitting: Physical Therapy

## 2019-01-10 ENCOUNTER — Encounter: Payer: Self-pay | Admitting: Physical Therapy

## 2019-01-10 ENCOUNTER — Other Ambulatory Visit: Payer: Self-pay

## 2019-01-10 DIAGNOSIS — G8929 Other chronic pain: Secondary | ICD-10-CM | POA: Diagnosis not present

## 2019-01-10 DIAGNOSIS — M6281 Muscle weakness (generalized): Secondary | ICD-10-CM | POA: Diagnosis not present

## 2019-01-10 DIAGNOSIS — M545 Low back pain, unspecified: Secondary | ICD-10-CM

## 2019-01-10 NOTE — Patient Instructions (Signed)
Access Code: [facility]  URL: [email or web address]  Date: 01/10/2019  Prepared by: Hilda Blades   Exercises Seated Piriformis Stretch - 3 reps - 20 seconds hold - 1-2x daily Seated Hip Stretch - 3 reps - 20 seconds hold - 1-2x daily Supine Lower Trunk Rotation - 10 reps - 1-2x daily Hooklying Isometric Hip Abduction with Belt - 10 reps - 5 sec hold hold - 1-2x daily Supine Hip Adduction Isometric with Ball - 10 reps - 5 sec hold hold - 1-2x daily Supine Double Bent Leg Lift - 5 reps - 2 sets - 5 seconds hold - 1-2x weekly Quadruped Alternating Leg Extensions - 10 reps - 2 sets - 1-2x weekly Bridge - 5 reps - 2 sets - 5 seconds hold - 1-2x weekly

## 2019-01-10 NOTE — Therapy (Addendum)
[place], [place], [address] Phone: [phone]   Fax:  [phone]  Physical Therapy Treatment  Patient Details  Name: Joanna Allen MRN: [id number] Date of Birth: 15-May-1975 Referring Provider (PT): Trey Sailors, [place]   Encounter Date: 01/10/2019  PT End of Session - 01/10/19 1614    Visit Number  2    Number of Visits  8    Date for PT Re-Evaluation  02/15/19    Authorization Type  UHC MCR    PT Start Time  0254    PT Stop Time  1655    PT Time Calculation (min)  40 min    Activity Tolerance  Patient tolerated treatment well    Behavior During Therapy  [facility] for tasks assessed/performed       Past Medical History:  Diagnosis Date  . Bronchitis   . Fibromyalgia   . Hyperlipidemia     Past Surgical History:  Procedure Laterality Date  . BUNIONECTOMY  2016  . HERNIA REPAIR    . TUBAL LIGATION      There were no vitals filed for this visit.  Subjective Assessment - 01/10/19 1613    Subjective  Patient reports her pain has remained the same. She denies any improvement.    Currently in Pain?  Yes    Pain Score  8     Pain Location  Back    Pain Orientation  Lower    Pain Descriptors / Indicators  Aching;Sharp    Pain Type  Chronic pain    Pain Onset  More than a month ago    Pain Frequency  Constant                       [facility] Adult PT Treatment/Exercise - 01/10/19 0001      Exercises   Exercises  Lumbar      Lumbar Exercises: Aerobic   Nustep  L4 x 5 min UE/LE      Lumbar Exercises: Supine   Ab Set  10 reps;3 seconds    AB Set Limitations  use of swiss ball     Bridge  5 reps;5 seconds    Bridge Limitations  mini-bridge (PPT)    Other Supine Lumbar Exercises  Lower trunk rotation x10 - remained within pain free range and cued for core engagement to keep movements slow and controlled    Other Supine Lumbar Exercises  Double bent knee leg lift 5 sec hold 2x5       Lumbar Exercises: Quadruped   Madcat/Old Horse  5 reps    Madcat/Old Horse Limitations  max cueing for technique and pelvic control    Opposite Arm/Leg Raise  10 reps   2 sets of 5 each   Opposite Arm/Leg Raise Limitations  legs only, max cueing for lumbar control and core activation             PT Education - 01/10/19 1613    Education Details  HEP    Person(s) Educated  Patient    Methods  Explanation;Demonstration;Verbal cues;Handout    Comprehension  Verbalized understanding;Returned demonstration;Verbal cues required;Need further instruction       PT Short Term Goals - 12/22/18 0905      PT SHORT TERM GOAL #1   Title  Patient will be I in HEP to progress in PT.    Time  4    Period  Weeks    Status  New    Target Date  01/18/19      PT SHORT TERM GOAL #2   Title  Patient will report reduced resting pain level to < or = 6/10    Time  4    Period  Weeks    Status  New    Target Date  01/18/19        PT Long Term Goals - 12/22/18 0909      PT LONG TERM GOAL #1   Title  Patient will report improved resting pain level to < or = 3-4/10 to allow for improved functional ability    Time  8    Period  Weeks    Status  New    Target Date  02/15/19      PT LONG TERM GOAL #2   Title  Patient will exhibit improved core and gluteal strength > or = 4/5 MMT to allow for improved lumbar control with lifting tasks.    Time  8    Period  Weeks    Status  New    Target Date  02/15/19      PT LONG TERM GOAL #3   Title  Patient will report improved functional level of < or = 30% disability on Modified Oswestry to indicate improve ability to sit/stand/perform household tasks.    Time  8    Period  Weeks    Status  New    Target Date  02/15/19            Plan - 01/10/19 1614    Clinical Impression Statement  Patient continues to have lower back pain that has not improved over the past few weeks. She has canceled her previous 2 PT visits but has been  consistent with exercises given at eval. She was able to tolerate progression in core strengthening this visit without increase in low back pain. She does require consistent cueing for proper core and gluteal engagament and technique to avoid excessive lumbar motion and improve control. Her symptoms continues to seem mechanical in nature without consistent radicular symptoms. She would benefit from continued skilled PT to progress her core and hip strength to improve lumbar control and reduce pain with activity.    PT Treatment/Interventions  Cryotherapy;Electrical Stimulation;Moist Heat;Traction;Neuromuscular re-education;Therapeutic exercise;Therapeutic activities;Patient/family education;Manual techniques;Dry needling;Spinal Manipulations;Joint Manipulations    PT Next Visit Plan  Assess HEP, progress core and hip activation exercise, pain science education as needed    PT Home Exercise Plan  Supine double leg lift, hooklying core activation with hip adduction towel squeeze isometric, hooklying core activation with hip abduction isometric, supine LTR, mini-bridge (PPT), quadruped hip extension    Consulted and Agree with Plan of Care  Patient       Patient will benefit from skilled therapeutic intervention in order to improve the following deficits and impairments:  Decreased endurance, Decreased activity tolerance, Pain, Decreased strength, Postural dysfunction  Visit Diagnosis: Chronic low back pain, unspecified back pain laterality, unspecified whether sciatica present  Muscle weakness (generalized)     Problem List Patient Active Problem List   Diagnosis Date Noted  . Morbid obesity ([place]) 07/27/2017  . OSA (obstructive sleep apnea) 10/28/2016    Hilda Blades, PT, DPT, LAT, ATC 01/10/19  5:17 PM Phone: [phone] Fax: [facility] [address] [place], [place], [address] Phone: [phone]   Fax:   [phone]  Name: Joanna Allen MRN: [id number] Date of Birth: 03/16/1975   PHYSICAL  THERAPY DISCHARGE SUMMARY  Visits from Start of Care: 2  Current functional level related to goals / functional outcomes: See above   Remaining deficits: See above   Education / Equipment: HEP  Plan: Patient agrees to discharge.  Patient goals were not met. Patient is being discharged due to not returning since the last visit.  ?????    Hilda Blades, PT, DPT, LAT, ATC 03/21/19  2:53 PM Phone: [phone] Fax: [phone]

## 2019-01-11 DIAGNOSIS — M545 Low back pain: Secondary | ICD-10-CM | POA: Diagnosis not present

## 2019-01-13 DIAGNOSIS — G4733 Obstructive sleep apnea (adult) (pediatric): Secondary | ICD-10-CM | POA: Diagnosis not present

## 2019-01-17 ENCOUNTER — Ambulatory Visit: Payer: Medicare Other | Admitting: Physical Therapy

## 2019-01-17 DIAGNOSIS — I10 Essential (primary) hypertension: Secondary | ICD-10-CM | POA: Diagnosis not present

## 2019-01-17 DIAGNOSIS — G8929 Other chronic pain: Secondary | ICD-10-CM | POA: Diagnosis not present

## 2019-01-17 DIAGNOSIS — M797 Fibromyalgia: Secondary | ICD-10-CM | POA: Diagnosis not present

## 2019-01-17 DIAGNOSIS — G4733 Obstructive sleep apnea (adult) (pediatric): Secondary | ICD-10-CM | POA: Diagnosis not present

## 2019-01-17 DIAGNOSIS — K219 Gastro-esophageal reflux disease without esophagitis: Secondary | ICD-10-CM | POA: Diagnosis not present

## 2019-01-18 ENCOUNTER — Ambulatory Visit: Payer: Medicare Other | Admitting: Physical Therapy

## 2019-04-11 DIAGNOSIS — Z01118 Encounter for examination of ears and hearing with other abnormal findings: Secondary | ICD-10-CM | POA: Diagnosis not present

## 2019-04-11 DIAGNOSIS — K219 Gastro-esophageal reflux disease without esophagitis: Secondary | ICD-10-CM | POA: Diagnosis not present

## 2019-04-11 DIAGNOSIS — M797 Fibromyalgia: Secondary | ICD-10-CM | POA: Diagnosis not present

## 2019-04-11 DIAGNOSIS — I10 Essential (primary) hypertension: Secondary | ICD-10-CM | POA: Diagnosis not present

## 2019-04-11 DIAGNOSIS — Z01021 Encounter for examination of eyes and vision following failed vision screening with abnormal findings: Secondary | ICD-10-CM | POA: Diagnosis not present

## 2019-04-11 DIAGNOSIS — Z1329 Encounter for screening for other suspected endocrine disorder: Secondary | ICD-10-CM | POA: Diagnosis not present

## 2019-04-11 DIAGNOSIS — E7800 Pure hypercholesterolemia, unspecified: Secondary | ICD-10-CM | POA: Diagnosis not present

## 2019-04-11 DIAGNOSIS — Z0001 Encounter for general adult medical examination with abnormal findings: Secondary | ICD-10-CM | POA: Diagnosis not present

## 2019-04-11 DIAGNOSIS — Z131 Encounter for screening for diabetes mellitus: Secondary | ICD-10-CM | POA: Diagnosis not present

## 2019-05-30 DIAGNOSIS — G4733 Obstructive sleep apnea (adult) (pediatric): Secondary | ICD-10-CM | POA: Diagnosis not present

## 2019-05-30 DIAGNOSIS — K219 Gastro-esophageal reflux disease without esophagitis: Secondary | ICD-10-CM | POA: Diagnosis not present

## 2019-05-30 DIAGNOSIS — M797 Fibromyalgia: Secondary | ICD-10-CM | POA: Diagnosis not present

## 2019-05-30 DIAGNOSIS — I10 Essential (primary) hypertension: Secondary | ICD-10-CM | POA: Diagnosis not present

## 2019-05-30 DIAGNOSIS — E7800 Pure hypercholesterolemia, unspecified: Secondary | ICD-10-CM | POA: Diagnosis not present

## 2019-09-12 ENCOUNTER — Emergency Department (HOSPITAL_COMMUNITY)
Admission: EM | Admit: 2019-09-12 | Discharge: 2019-09-12 | Disposition: A | Discharge status: Home / Self Care | Payer: Medicare Other | Attending: Emergency Medicine | Admitting: Emergency Medicine

## 2019-09-12 ENCOUNTER — Encounter (HOSPITAL_COMMUNITY): Payer: Self-pay | Admitting: *Deleted

## 2019-09-12 ENCOUNTER — Other Ambulatory Visit: Payer: Self-pay

## 2019-09-12 ENCOUNTER — Emergency Department (HOSPITAL_COMMUNITY): Payer: Medicare Other

## 2019-09-12 DIAGNOSIS — G4733 Obstructive sleep apnea (adult) (pediatric): Secondary | ICD-10-CM | POA: Diagnosis not present

## 2019-09-12 DIAGNOSIS — Z5321 Procedure and treatment not carried out due to patient leaving prior to being seen by health care provider: Secondary | ICD-10-CM | POA: Insufficient documentation

## 2019-09-12 DIAGNOSIS — Z7982 Long term (current) use of aspirin: Secondary | ICD-10-CM | POA: Insufficient documentation

## 2019-09-12 DIAGNOSIS — J9811 Atelectasis: Secondary | ICD-10-CM | POA: Diagnosis not present

## 2019-09-12 DIAGNOSIS — R079 Chest pain, unspecified: Secondary | ICD-10-CM | POA: Diagnosis not present

## 2019-09-12 DIAGNOSIS — R109 Unspecified abdominal pain: Secondary | ICD-10-CM | POA: Diagnosis not present

## 2019-09-12 DIAGNOSIS — M797 Fibromyalgia: Secondary | ICD-10-CM | POA: Diagnosis not present

## 2019-09-12 DIAGNOSIS — R42 Dizziness and giddiness: Secondary | ICD-10-CM | POA: Diagnosis not present

## 2019-09-12 DIAGNOSIS — R200 Anesthesia of skin: Secondary | ICD-10-CM | POA: Diagnosis not present

## 2019-09-12 DIAGNOSIS — E7800 Pure hypercholesterolemia, unspecified: Secondary | ICD-10-CM | POA: Diagnosis not present

## 2019-09-12 DIAGNOSIS — K219 Gastro-esophageal reflux disease without esophagitis: Secondary | ICD-10-CM | POA: Diagnosis not present

## 2019-09-12 DIAGNOSIS — R5383 Other fatigue: Secondary | ICD-10-CM | POA: Diagnosis not present

## 2019-09-12 DIAGNOSIS — I10 Essential (primary) hypertension: Secondary | ICD-10-CM | POA: Diagnosis not present

## 2019-09-12 NOTE — ED Triage Notes (Addendum)
Patient with reported onset of chest pain on Sunday.  She is also reporting bil side pain.  She denies trauma.  She denies fever.  No reported changes to urine output.  No reported diarrhea.  Patient states the pain is more in the center of her chest and at times is sharp with deep breath.  She reports feeling lightheaded as well.  Patient is fatigued in presentation.  Patient has taken aspirin today.  Patient does not take bp med.  Her pressure is noted to be 103/73 in triage.  Patient added that her right side has been intermittently numb for the past 2 days.  It is currently feeling numb  No obvious weakness noted on exam.  No drift facial nor radial on exam

## 2019-09-18 DIAGNOSIS — I10 Essential (primary) hypertension: Secondary | ICD-10-CM | POA: Diagnosis not present

## 2019-09-18 DIAGNOSIS — M797 Fibromyalgia: Secondary | ICD-10-CM | POA: Diagnosis not present

## 2019-09-18 DIAGNOSIS — G4733 Obstructive sleep apnea (adult) (pediatric): Secondary | ICD-10-CM | POA: Diagnosis not present

## 2019-09-18 DIAGNOSIS — E7800 Pure hypercholesterolemia, unspecified: Secondary | ICD-10-CM | POA: Diagnosis not present

## 2019-09-18 DIAGNOSIS — K219 Gastro-esophageal reflux disease without esophagitis: Secondary | ICD-10-CM | POA: Diagnosis not present

## 2019-09-22 DIAGNOSIS — I10 Essential (primary) hypertension: Secondary | ICD-10-CM | POA: Diagnosis not present

## 2019-09-22 DIAGNOSIS — I119 Hypertensive heart disease without heart failure: Secondary | ICD-10-CM | POA: Diagnosis not present

## 2019-10-11 DIAGNOSIS — I10 Essential (primary) hypertension: Secondary | ICD-10-CM | POA: Diagnosis not present

## 2019-10-11 DIAGNOSIS — E7800 Pure hypercholesterolemia, unspecified: Secondary | ICD-10-CM | POA: Diagnosis not present

## 2019-10-11 DIAGNOSIS — M797 Fibromyalgia: Secondary | ICD-10-CM | POA: Diagnosis not present

## 2019-10-11 DIAGNOSIS — R0789 Other chest pain: Secondary | ICD-10-CM | POA: Diagnosis not present

## 2019-10-11 DIAGNOSIS — K219 Gastro-esophageal reflux disease without esophagitis: Secondary | ICD-10-CM | POA: Diagnosis not present

## 2019-10-20 DIAGNOSIS — Z1231 Encounter for screening mammogram for malignant neoplasm of breast: Secondary | ICD-10-CM | POA: Diagnosis not present

## 2019-10-27 ENCOUNTER — Encounter: Payer: Self-pay | Admitting: Primary Care

## 2019-10-27 ENCOUNTER — Ambulatory Visit (INDEPENDENT_AMBULATORY_CARE_PROVIDER_SITE_OTHER): Payer: Medicare Other

## 2019-10-27 ENCOUNTER — Ambulatory Visit (INDEPENDENT_AMBULATORY_CARE_PROVIDER_SITE_OTHER): Payer: Medicare Other | Admitting: Primary Care

## 2019-10-27 ENCOUNTER — Other Ambulatory Visit: Payer: Self-pay

## 2019-10-27 VITALS — BP 120/68 | HR 68 | Temp 98.2°F | Ht 67.0 in | Wt 279.2 lb

## 2019-10-27 DIAGNOSIS — G4733 Obstructive sleep apnea (adult) (pediatric): Secondary | ICD-10-CM | POA: Diagnosis not present

## 2019-10-27 DIAGNOSIS — R0602 Shortness of breath: Secondary | ICD-10-CM | POA: Diagnosis not present

## 2019-10-27 DIAGNOSIS — R079 Chest pain, unspecified: Secondary | ICD-10-CM | POA: Diagnosis not present

## 2019-10-27 DIAGNOSIS — R0609 Other forms of dyspnea: Secondary | ICD-10-CM | POA: Diagnosis not present

## 2019-10-27 LAB — CBC WITH DIFFERENTIAL/PLATELET
Basophils Absolute: 0.1 10*3/uL (ref 0.0–0.1)
Basophils Relative: 1.1 % (ref 0.0–3.0)
Eosinophils Absolute: 0.2 10*3/uL (ref 0.0–0.7)
Eosinophils Relative: 3.7 % (ref 0.0–5.0)
HCT: 36.4 % (ref 36.0–46.0)
Hemoglobin: 12 g/dL (ref 12.0–15.0)
Lymphocytes Relative: 45.6 % (ref 12.0–46.0)
Lymphs Abs: 2.2 10*3/uL (ref 0.7–4.0)
MCHC: 32.9 g/dL (ref 30.0–36.0)
MCV: 86.3 fl (ref 78.0–100.0)
Monocytes Absolute: 0.5 10*3/uL (ref 0.1–1.0)
Monocytes Relative: 10.3 % (ref 3.0–12.0)
Neutro Abs: 1.9 10*3/uL (ref 1.4–7.7)
Neutrophils Relative %: 39.3 % — ABNORMAL LOW (ref 43.0–77.0)
Platelets: 252 10*3/uL (ref 150.0–400.0)
RBC: 4.22 Mil/uL (ref 3.87–5.11)
RDW: 15.7 % — ABNORMAL HIGH (ref 11.5–15.5)
WBC: 4.8 10*3/uL (ref 4.0–10.5)

## 2019-10-27 LAB — BRAIN NATRIURETIC PEPTIDE: Pro B Natriuretic peptide (BNP): 130 pg/mL — ABNORMAL HIGH (ref 0.0–100.0)

## 2019-10-27 LAB — D-DIMER, QUANTITATIVE: D-Dimer, Quant: 3.97 mcg/mL FEU — ABNORMAL HIGH (ref <0.50)

## 2019-10-27 NOTE — Patient Instructions (Addendum)
Recommendations: - Resume CPAP, aim to wear every night for 4-6 hours or more - Continue to use albuterol 2 puffs every 4-6 hours as needed for shortness of breath/chest tightness (try using prior to activity that causes chest tightness to see if they helps) - Recommend you follow-up with GI regarding chronic constipation   Orders: - Change pressure CPAP auto titrate 10-15cm h20 - Full pulmonary function testing and FENO re: shortness of breath  - CXR re: shortness of breath (orderd) - Labs today (ordered)  Follow-up: - 4-6 weeks with Dr. Elsworth Soho to review testing       Constipation, Adult Constipation is when a person:  Poops (has a bowel movement) fewer times in a week than normal.  Has a hard time pooping.  Has poop that is dry, hard, or bigger than normal. Follow these instructions at home: Eating and drinking   Eat foods that have a lot of fiber, such as: ? Fresh fruits and vegetables. ? Whole grains. ? Beans.  Eat less of foods that are high in fat, low in fiber, or overly processed, such as: ? [place] fries. ? Hamburgers. ? Cookies. ? Candy. ? Soda.  Drink enough fluid to keep your pee (urine) clear or pale yellow. General instructions  Exercise regularly or as told by your doctor.  Go to the restroom when you feel like you need to poop. Do not hold it in.  Take over-the-counter and prescription medicines only as told by your doctor. These include any fiber supplements.  Do pelvic floor retraining exercises, such as: ? Doing deep breathing while relaxing your lower belly (abdomen). ? Relaxing your pelvic floor while pooping.  Watch your condition for any changes.  Keep all follow-up visits as told by your doctor. This is important. Contact a doctor if:  You have pain that gets worse.  You have a fever.  You have not pooped for 4 days.  You throw up (vomit).  You are not hungry.  You lose weight.  You are bleeding from the anus.  You have  thin, pencil-like poop (stool). Get help right away if:  You have a fever, and your symptoms suddenly get worse.  You leak poop or have blood in your poop.  Your belly feels hard or bigger than normal (is bloated).  You have very bad belly pain.  You feel dizzy or you faint. This information is not intended to replace advice given to you by your health care provider. Make sure you discuss any questions you have with your health care provider. Document Revised: 12/04/2016 Document Reviewed: 06/12/2015 Elsevier Patient Education  2020 [employer].

## 2019-10-27 NOTE — Progress Notes (Signed)
@Patient  ID: Joanna Allen, female    DOB: 11/24/1975, 44 y.o.   MRN: [id number]  Chief Complaint  Patient presents with  . Follow-up    Pt last seen in office 07/27/17.  Pt states she has been having a lot of tightness in chest for the past month, has had complaints of wheezing, and also has had a dry cough.    Referring provider: Benito Mccreedy, MD  HPI: 44 year old female, never smoked.  Past medical history significant for obstructive sleep apnea, obesity, hypertension, hypercholesterolemia, GERD, fibromyalgia and migraine headaches.  Patient of Dr. Elsworth Soho, last seen by pulmonary nurse practitioner on 07/27/2017.  PSG 06/2016 showed mild OSA with AHI 12/hr, SPO2 low 78%.  CPAP titration 09/2016, 11 cm H2O, medium full facemask  10/27/2019 Patient presents today for acute office visit.  Patient has not been seen in our office in over 2 years.  She is followed for mild obstructive sleep apnea with pulmonary office.  Patient reports shortness of breath which is new since September 2021. She experiences chest tightness with any sort of exertion.  She went to ED on 09/12/19, CXR showed left base atelectasis, earliest changes of pneumonia. Head CT was negative. EKG normal. She has dry cough which also started around the same time. Occurs when she laughs too hard or gets excited. She uses rescue inhaler twice a day with no improvement. Finds it hard to breath at night with CPAP. She has lost 10 lbs. She is on Lipitor for hypercholesterolemia. She had an echocardiogram with PCP. No recent surgery or travel. Her mother smoked when she was young in her house.    Allergies  Allergen Reactions  . Amoxicillin Hives     There is no immunization history on file for this patient.  Past Medical History:  Diagnosis Date  . Bronchitis   . Fibromyalgia   . Hyperlipidemia     Tobacco History: Social History   Tobacco Use  Smoking Status Never Smoker  Smokeless Tobacco Never Used   Counseling  given: Not Answered   Outpatient Medications Prior to Visit  Medication Sig Dispense Refill  . acetaminophen (TYLENOL) 500 MG tablet Take 1,000 mg by mouth every 6 (six) hours as needed for mild pain.    Marland Kitchen albuterol (PROVENTIL HFA;VENTOLIN HFA) 108 (90 Base) MCG/ACT inhaler Inhale into the lungs every 6 (six) hours as needed for wheezing or shortness of breath.    Marland Kitchen atorvastatin (LIPITOR) 40 MG tablet Take 40 mg by mouth daily.    . cetirizine (ZYRTEC) 10 MG tablet Take 10 mg by mouth daily.  0  . cyclobenzaprine (FLEXERIL) 10 MG tablet Take 10 mg by mouth 3 (three) times daily as needed for muscle spasms.    . diclofenac (VOLTAREN) 75 MG EC tablet     . ergocalciferol (VITAMIN D2) 50000 units capsule Take 50,000 Units by mouth once a week.    . gabapentin (NEURONTIN) 300 MG capsule TK ONE C PO  BID    . ibuprofen (ADVIL,MOTRIN) 600 MG tablet Take 1 tablet (600 mg total) by mouth every 6 (six) hours as needed. 30 tablet 0  . meloxicam (MOBIC) 15 MG tablet TK 1 T PO QD    . NUCYNTA ER 100 MG 12 hr tablet   0  . pantoprazole (PROTONIX) 40 MG tablet TK 1 T PO QD    . phentermine 37.5 MG capsule Take 37.5 mg by mouth every morning.    . pregabalin (LYRICA) 50 MG capsule Take 50  mg by mouth 2 (two) times daily.    . ranitidine (ZANTAC) 300 MG tablet Take 300 mg by mouth at bedtime.  0  . tiZANidine (ZANAFLEX) 4 MG tablet Take 4-8 mg by mouth every 8 (eight) hours as needed.    . Vitamin D, Ergocalciferol, (DRISDOL) 50000 units CAPS capsule Take 1 capsule by mouth once a week. Saturday  0   No facility-administered medications prior to visit.   Review of Systems  Review of Systems  Constitutional: Negative.   HENT: Negative.   Respiratory: Positive for cough, chest tightness and shortness of breath. Negative for wheezing.   Cardiovascular: Negative.    Physical Exam  BP 120/68 (BP Location: Left Arm, Cuff Size: Normal)   Pulse 68   Temp 98.2 F (36.8 C) (Other (Comment)) Comment  (Src): wrist  Ht 5\' 7"  (1.702 m)   Wt 279 lb 3.2 oz (126.6 kg)   SpO2 98%   BMI 43.73 kg/m  Physical Exam Constitutional:      General: She is not in acute distress.    Appearance: Normal appearance. She is not ill-appearing.  HENT:     Head: Normocephalic and atraumatic.     Right Ear: Tympanic membrane normal.     Left Ear: Tympanic membrane normal.     Nose: Nose normal.     Mouth/Throat:     Mouth: Mucous membranes are moist.     Pharynx: Oropharynx is clear.  Cardiovascular:     Rate and Rhythm: Normal rate and regular rhythm.     Heart sounds: No murmur heard.      Comments: No edema Pulmonary:     Effort: Pulmonary effort is normal.     Breath sounds: Normal breath sounds. No wheezing.  Musculoskeletal:        General: Normal range of motion.     Cervical back: Normal range of motion and neck supple. No tenderness.  Lymphadenopathy:     Cervical: No cervical adenopathy.  Skin:    General: Skin is warm and dry.  Neurological:     Mental Status: She is alert.  Psychiatric:        Mood and Affect: Mood normal.        Behavior: Behavior normal.        Thought Content: Thought content normal.        Judgment: Judgment normal.      Lab Results:  CBC    Component Value Date/Time   WBC 4.8 10/27/2019 1444   RBC 4.22 10/27/2019 1444   HGB 12.0 10/27/2019 1444   HCT 36.4 10/27/2019 1444   PLT 252.0 10/27/2019 1444   MCV 86.3 10/27/2019 1444   MCH 28.2 10/04/2016 1540   MCHC 32.9 10/27/2019 1444   RDW 15.7 (H) 10/27/2019 1444   LYMPHSABS 2.2 10/27/2019 1444   MONOABS 0.5 10/27/2019 1444   EOSABS 0.2 10/27/2019 1444   BASOSABS 0.1 10/27/2019 1444    BMET    Component Value Date/Time   NA 139 03/13/2015 1715   K 3.5 03/13/2015 1715   CL 105 03/13/2015 1715   CO2 23 03/13/2015 1715   GLUCOSE 84 03/13/2015 1715   BUN 8 03/13/2015 1715   CREATININE 0.91 03/13/2015 1715   CALCIUM 9.6 03/13/2015 1715   GFRNONAA >60 03/13/2015 1715   GFRAA >60 03/13/2015  1715    BNP No results found for: BNP  ProBNP    Component Value Date/Time   PROBNP 130.0 (H) 10/27/2019 1444    Imaging:  DG Chest 2 View  Result Date: 10/29/2019 CLINICAL DATA:  Chest pain and shortness of breath. EXAM: CHEST - 2 VIEW COMPARISON:  09/12/2019 chest radiograph FINDINGS: The cardiomediastinal silhouette is unremarkable. Mild peribronchial thickening again noted. There is no evidence of focal airspace disease, pulmonary edema, suspicious pulmonary nodule/mass, pleural effusion, or pneumothorax. No acute bony abnormalities are identified. IMPRESSION: No active cardiopulmonary disease. Mild chronic peribronchial thickening. Electronically Signed   By: Margarette Canada M.D.   On: 10/29/2019 16:14   CT Angio Chest W/Cm &/Or Wo Cm  Result Date: 10/31/2019 CLINICAL DATA:  Shortness of breath. Chest tightness for 3 months. No history cancer, surgery. Nonsmoker. EXAM: CT ANGIOGRAPHY CHEST WITH CONTRAST TECHNIQUE: Multidetector CT imaging of the chest was performed using the standard protocol during bolus administration of intravenous contrast. Multiplanar CT image reconstructions and MIPs were obtained to evaluate the vascular anatomy. CONTRAST:  174mL ISOVUE-370 IOPAMIDOL (ISOVUE-370) INJECTION 76% COMPARISON:  Chest radiographs including 10/29/2019. FINDINGS: Cardiovascular: The quality of this exam for evaluation of pulmonary embolism is moderate. Suboptimal bolus timing, with the contrast centered in the SVC. No pulmonary embolism to the large segmental level. Normal aortic caliber. Normal heart size, without pericardial effusion. Mediastinum/Nodes: No mediastinal or hilar adenopathy. Lungs/Pleura: No pleural fluid. Minimal subpleural nodularity x2 at on the order of 1-2 mm in the left lower lobe on 83/11. Likely subpleural lymph nodes in this nonsmoker. Upper Abdomen: Normal imaged portions of the liver, spleen, stomach, pancreas, gallbladder, adrenal glands, kidneys. Musculoskeletal: No  acute osseous abnormality. Review of the MIP images confirms the above findings. IMPRESSION: 1. Moderate quality evaluation for pulmonary embolism. No pulmonary embolism to the large segmental level. 2. No other explanation for patient's symptoms. Electronically Signed   By: Abigail Miyamoto M.D.   On: 10/31/2019 13:14     Assessment & Plan:   OSA (obstructive sleep apnea) - Poor compliance with CPAP, feels its hard to breath with mask on at night. She has lost 10 lbs. No [employer] available. Recommend changing CPAP pressure from 12cm h20 to auto titrate 10-15cm h20. Advised patient to aim to wear CPAP every night for 4-6 hours or more  Dyspnea - CXR on 09/22/19 showed left base atelectasis, earliest signs of pneumonia. CXR showed no acute cardiopulmonary process, mild chronic peribronchial thickening. D-dimer was elevated, CTA showed no signs of PE. Recommend getting full PFTs.    Martyn Ehrich, NP 11/14/2019

## 2019-10-31 ENCOUNTER — Other Ambulatory Visit: Payer: Self-pay | Admitting: *Deleted

## 2019-10-31 ENCOUNTER — Other Ambulatory Visit: Payer: Self-pay

## 2019-10-31 ENCOUNTER — Ambulatory Visit
Admission: RE | Admit: 2019-10-31 | Discharge: 2019-10-31 | Disposition: A | Discharge status: Home / Self Care | Payer: Medicare Other | Source: Ambulatory Visit | Attending: Primary Care | Admitting: Primary Care

## 2019-10-31 DIAGNOSIS — R0602 Shortness of breath: Secondary | ICD-10-CM | POA: Diagnosis not present

## 2019-10-31 DIAGNOSIS — R0789 Other chest pain: Secondary | ICD-10-CM | POA: Diagnosis not present

## 2019-10-31 DIAGNOSIS — R079 Chest pain, unspecified: Secondary | ICD-10-CM

## 2019-10-31 DIAGNOSIS — R7989 Other specified abnormal findings of blood chemistry: Secondary | ICD-10-CM

## 2019-10-31 MED ORDER — IOPAMIDOL (ISOVUE-370) INJECTION 76%
100.0000 mL | Freq: Once | INTRAVENOUS | Status: AC | PRN
  Administered 2019-10-31: 100 mL via INTRAVENOUS

## 2019-10-31 NOTE — Progress Notes (Signed)
CXR showed no evidence of pneumonia, pulmonary effusion, nodules/masses. Mild chronic peribronchial thickening. Depending on pulmonary function testing we may want to consider CT chest to evaluate further. Nothing acutely concerning at the moment.

## 2019-10-31 NOTE — Progress Notes (Signed)
Patient needs CTA stat re: elevated D-dimer

## 2019-11-14 DIAGNOSIS — R0600 Dyspnea, unspecified: Secondary | ICD-10-CM | POA: Insufficient documentation

## 2019-11-14 NOTE — Assessment & Plan Note (Addendum)
-   Poor compliance with CPAP, feels its hard to breath with mask on at night. She has lost 10 lbs. No [employer] available. Recommend changing CPAP pressure from 12cm h20 to auto titrate 10-15cm h20. Advised patient to aim to wear CPAP every night for 4-6 hours or more

## 2019-11-14 NOTE — Assessment & Plan Note (Addendum)
-   CXR on 09/22/19 showed left base atelectasis, earliest signs of pneumonia. CXR showed no acute cardiopulmonary process, mild chronic peribronchial thickening. D-dimer was elevated, CTA showed no signs of PE. Recommend getting full PFTs.

## 2020-01-17 DIAGNOSIS — E7800 Pure hypercholesterolemia, unspecified: Secondary | ICD-10-CM | POA: Diagnosis not present

## 2020-01-17 DIAGNOSIS — R059 Cough, unspecified: Secondary | ICD-10-CM | POA: Diagnosis not present

## 2020-01-17 DIAGNOSIS — R7303 Prediabetes: Secondary | ICD-10-CM | POA: Diagnosis not present

## 2020-01-17 DIAGNOSIS — Z20822 Contact with and (suspected) exposure to covid-19: Secondary | ICD-10-CM | POA: Diagnosis not present

## 2020-01-17 DIAGNOSIS — G4733 Obstructive sleep apnea (adult) (pediatric): Secondary | ICD-10-CM | POA: Diagnosis not present

## 2020-01-17 DIAGNOSIS — M797 Fibromyalgia: Secondary | ICD-10-CM | POA: Diagnosis not present

## 2020-01-17 DIAGNOSIS — I10 Essential (primary) hypertension: Secondary | ICD-10-CM | POA: Diagnosis not present

## 2020-01-17 DIAGNOSIS — K219 Gastro-esophageal reflux disease without esophagitis: Secondary | ICD-10-CM | POA: Diagnosis not present

## 2020-03-05 DIAGNOSIS — G4733 Obstructive sleep apnea (adult) (pediatric): Secondary | ICD-10-CM | POA: Diagnosis not present

## 2020-04-09 DIAGNOSIS — K219 Gastro-esophageal reflux disease without esophagitis: Secondary | ICD-10-CM | POA: Diagnosis not present

## 2020-04-09 DIAGNOSIS — I10 Essential (primary) hypertension: Secondary | ICD-10-CM | POA: Diagnosis not present

## 2020-04-09 DIAGNOSIS — M797 Fibromyalgia: Secondary | ICD-10-CM | POA: Diagnosis not present

## 2020-04-09 DIAGNOSIS — E7800 Pure hypercholesterolemia, unspecified: Secondary | ICD-10-CM | POA: Diagnosis not present

## 2020-04-09 DIAGNOSIS — G4733 Obstructive sleep apnea (adult) (pediatric): Secondary | ICD-10-CM | POA: Diagnosis not present

## 2020-04-09 DIAGNOSIS — Z0000 Encounter for general adult medical examination without abnormal findings: Secondary | ICD-10-CM | POA: Diagnosis not present

## 2020-04-09 DIAGNOSIS — M7061 Trochanteric bursitis, right hip: Secondary | ICD-10-CM | POA: Diagnosis not present

## 2020-04-09 DIAGNOSIS — R7303 Prediabetes: Secondary | ICD-10-CM | POA: Diagnosis not present

## 2020-05-09 DIAGNOSIS — G4733 Obstructive sleep apnea (adult) (pediatric): Secondary | ICD-10-CM | POA: Diagnosis not present

## 2020-05-09 DIAGNOSIS — M797 Fibromyalgia: Secondary | ICD-10-CM | POA: Diagnosis not present

## 2020-05-09 DIAGNOSIS — I10 Essential (primary) hypertension: Secondary | ICD-10-CM | POA: Diagnosis not present

## 2020-05-09 DIAGNOSIS — E7800 Pure hypercholesterolemia, unspecified: Secondary | ICD-10-CM | POA: Diagnosis not present

## 2020-05-09 DIAGNOSIS — K219 Gastro-esophageal reflux disease without esophagitis: Secondary | ICD-10-CM | POA: Diagnosis not present

## 2020-05-09 DIAGNOSIS — R7303 Prediabetes: Secondary | ICD-10-CM | POA: Diagnosis not present

## 2020-07-12 DIAGNOSIS — Z0000 Encounter for general adult medical examination without abnormal findings: Secondary | ICD-10-CM | POA: Diagnosis not present

## 2020-07-12 DIAGNOSIS — K219 Gastro-esophageal reflux disease without esophagitis: Secondary | ICD-10-CM | POA: Diagnosis not present

## 2020-07-12 DIAGNOSIS — G4733 Obstructive sleep apnea (adult) (pediatric): Secondary | ICD-10-CM | POA: Diagnosis not present

## 2020-07-12 DIAGNOSIS — I10 Essential (primary) hypertension: Secondary | ICD-10-CM | POA: Diagnosis not present

## 2020-07-12 DIAGNOSIS — M797 Fibromyalgia: Secondary | ICD-10-CM | POA: Diagnosis not present

## 2020-07-12 DIAGNOSIS — J0110 Acute frontal sinusitis, unspecified: Secondary | ICD-10-CM | POA: Diagnosis not present

## 2020-07-12 DIAGNOSIS — M25561 Pain in right knee: Secondary | ICD-10-CM | POA: Diagnosis not present

## 2020-07-12 DIAGNOSIS — E7800 Pure hypercholesterolemia, unspecified: Secondary | ICD-10-CM | POA: Diagnosis not present

## 2020-07-12 DIAGNOSIS — R7303 Prediabetes: Secondary | ICD-10-CM | POA: Diagnosis not present

## 2020-09-11 DIAGNOSIS — K219 Gastro-esophageal reflux disease without esophagitis: Secondary | ICD-10-CM | POA: Diagnosis not present

## 2020-09-11 DIAGNOSIS — M797 Fibromyalgia: Secondary | ICD-10-CM | POA: Diagnosis not present

## 2020-09-11 DIAGNOSIS — I10 Essential (primary) hypertension: Secondary | ICD-10-CM | POA: Diagnosis not present

## 2020-09-11 DIAGNOSIS — R7303 Prediabetes: Secondary | ICD-10-CM | POA: Diagnosis not present

## 2020-09-11 DIAGNOSIS — J302 Other seasonal allergic rhinitis: Secondary | ICD-10-CM | POA: Diagnosis not present

## 2020-09-11 DIAGNOSIS — E7800 Pure hypercholesterolemia, unspecified: Secondary | ICD-10-CM | POA: Diagnosis not present

## 2020-09-11 DIAGNOSIS — Z8719 Personal history of other diseases of the digestive system: Secondary | ICD-10-CM | POA: Diagnosis not present

## 2020-09-11 DIAGNOSIS — G4733 Obstructive sleep apnea (adult) (pediatric): Secondary | ICD-10-CM | POA: Diagnosis not present

## 2020-10-01 DIAGNOSIS — G4733 Obstructive sleep apnea (adult) (pediatric): Secondary | ICD-10-CM | POA: Diagnosis not present

## 2020-10-22 DIAGNOSIS — R7303 Prediabetes: Secondary | ICD-10-CM | POA: Diagnosis not present

## 2020-10-22 DIAGNOSIS — E7800 Pure hypercholesterolemia, unspecified: Secondary | ICD-10-CM | POA: Diagnosis not present

## 2020-10-22 DIAGNOSIS — I10 Essential (primary) hypertension: Secondary | ICD-10-CM | POA: Diagnosis not present

## 2020-10-22 DIAGNOSIS — Z8719 Personal history of other diseases of the digestive system: Secondary | ICD-10-CM | POA: Diagnosis not present

## 2020-10-22 DIAGNOSIS — K219 Gastro-esophageal reflux disease without esophagitis: Secondary | ICD-10-CM | POA: Diagnosis not present

## 2020-10-22 DIAGNOSIS — M797 Fibromyalgia: Secondary | ICD-10-CM | POA: Diagnosis not present

## 2020-10-22 DIAGNOSIS — J302 Other seasonal allergic rhinitis: Secondary | ICD-10-CM | POA: Diagnosis not present

## 2020-10-22 DIAGNOSIS — Z23 Encounter for immunization: Secondary | ICD-10-CM | POA: Diagnosis not present

## 2020-10-22 DIAGNOSIS — G4733 Obstructive sleep apnea (adult) (pediatric): Secondary | ICD-10-CM | POA: Diagnosis not present

## 2020-10-25 DIAGNOSIS — Z1231 Encounter for screening mammogram for malignant neoplasm of breast: Secondary | ICD-10-CM | POA: Diagnosis not present

## 2020-12-30 DIAGNOSIS — G4733 Obstructive sleep apnea (adult) (pediatric): Secondary | ICD-10-CM | POA: Diagnosis not present

## 2021-01-22 DIAGNOSIS — I10 Essential (primary) hypertension: Secondary | ICD-10-CM | POA: Diagnosis not present

## 2021-01-22 DIAGNOSIS — M797 Fibromyalgia: Secondary | ICD-10-CM | POA: Diagnosis not present

## 2021-01-22 DIAGNOSIS — R7303 Prediabetes: Secondary | ICD-10-CM | POA: Diagnosis not present

## 2021-01-22 DIAGNOSIS — Z8719 Personal history of other diseases of the digestive system: Secondary | ICD-10-CM | POA: Diagnosis not present

## 2021-01-22 DIAGNOSIS — J302 Other seasonal allergic rhinitis: Secondary | ICD-10-CM | POA: Diagnosis not present

## 2021-01-22 DIAGNOSIS — E7800 Pure hypercholesterolemia, unspecified: Secondary | ICD-10-CM | POA: Diagnosis not present

## 2021-01-22 DIAGNOSIS — G4733 Obstructive sleep apnea (adult) (pediatric): Secondary | ICD-10-CM | POA: Diagnosis not present

## 2021-01-22 DIAGNOSIS — K219 Gastro-esophageal reflux disease without esophagitis: Secondary | ICD-10-CM | POA: Diagnosis not present

## 2021-03-06 DIAGNOSIS — G4733 Obstructive sleep apnea (adult) (pediatric): Secondary | ICD-10-CM | POA: Diagnosis not present

## 2021-03-06 DIAGNOSIS — I10 Essential (primary) hypertension: Secondary | ICD-10-CM | POA: Diagnosis not present

## 2021-03-06 DIAGNOSIS — J302 Other seasonal allergic rhinitis: Secondary | ICD-10-CM | POA: Diagnosis not present

## 2021-03-06 DIAGNOSIS — E7800 Pure hypercholesterolemia, unspecified: Secondary | ICD-10-CM | POA: Diagnosis not present

## 2021-03-06 DIAGNOSIS — K219 Gastro-esophageal reflux disease without esophagitis: Secondary | ICD-10-CM | POA: Diagnosis not present

## 2021-03-06 DIAGNOSIS — E559 Vitamin D deficiency, unspecified: Secondary | ICD-10-CM | POA: Diagnosis not present

## 2021-03-06 DIAGNOSIS — Z0000 Encounter for general adult medical examination without abnormal findings: Secondary | ICD-10-CM | POA: Diagnosis not present

## 2021-03-06 DIAGNOSIS — Z8719 Personal history of other diseases of the digestive system: Secondary | ICD-10-CM | POA: Diagnosis not present

## 2021-03-06 DIAGNOSIS — R7303 Prediabetes: Secondary | ICD-10-CM | POA: Diagnosis not present

## 2021-03-06 DIAGNOSIS — M797 Fibromyalgia: Secondary | ICD-10-CM | POA: Diagnosis not present

## 2021-04-03 DIAGNOSIS — Z8719 Personal history of other diseases of the digestive system: Secondary | ICD-10-CM | POA: Diagnosis not present

## 2021-04-03 DIAGNOSIS — J302 Other seasonal allergic rhinitis: Secondary | ICD-10-CM | POA: Diagnosis not present

## 2021-04-03 DIAGNOSIS — E7800 Pure hypercholesterolemia, unspecified: Secondary | ICD-10-CM | POA: Diagnosis not present

## 2021-04-03 DIAGNOSIS — K219 Gastro-esophageal reflux disease without esophagitis: Secondary | ICD-10-CM | POA: Diagnosis not present

## 2021-04-03 DIAGNOSIS — M797 Fibromyalgia: Secondary | ICD-10-CM | POA: Diagnosis not present

## 2021-04-03 DIAGNOSIS — I10 Essential (primary) hypertension: Secondary | ICD-10-CM | POA: Diagnosis not present

## 2021-04-03 DIAGNOSIS — R7303 Prediabetes: Secondary | ICD-10-CM | POA: Diagnosis not present

## 2021-04-03 DIAGNOSIS — G4733 Obstructive sleep apnea (adult) (pediatric): Secondary | ICD-10-CM | POA: Diagnosis not present

## 2021-06-25 DIAGNOSIS — M797 Fibromyalgia: Secondary | ICD-10-CM | POA: Diagnosis not present

## 2021-06-25 DIAGNOSIS — K219 Gastro-esophageal reflux disease without esophagitis: Secondary | ICD-10-CM | POA: Diagnosis not present

## 2021-06-25 DIAGNOSIS — J302 Other seasonal allergic rhinitis: Secondary | ICD-10-CM | POA: Diagnosis not present

## 2021-06-25 DIAGNOSIS — E7800 Pure hypercholesterolemia, unspecified: Secondary | ICD-10-CM | POA: Diagnosis not present

## 2021-06-25 DIAGNOSIS — R7303 Prediabetes: Secondary | ICD-10-CM | POA: Diagnosis not present

## 2021-06-25 DIAGNOSIS — G4733 Obstructive sleep apnea (adult) (pediatric): Secondary | ICD-10-CM | POA: Diagnosis not present

## 2021-06-25 DIAGNOSIS — M79672 Pain in left foot: Secondary | ICD-10-CM | POA: Diagnosis not present

## 2021-06-25 DIAGNOSIS — M25561 Pain in right knee: Secondary | ICD-10-CM | POA: Diagnosis not present

## 2021-06-25 DIAGNOSIS — I10 Essential (primary) hypertension: Secondary | ICD-10-CM | POA: Diagnosis not present

## 2021-06-25 DIAGNOSIS — Z8719 Personal history of other diseases of the digestive system: Secondary | ICD-10-CM | POA: Diagnosis not present

## 2021-07-04 DIAGNOSIS — M25561 Pain in right knee: Secondary | ICD-10-CM | POA: Diagnosis not present

## 2021-07-04 DIAGNOSIS — E7800 Pure hypercholesterolemia, unspecified: Secondary | ICD-10-CM | POA: Diagnosis not present

## 2021-07-04 DIAGNOSIS — I10 Essential (primary) hypertension: Secondary | ICD-10-CM | POA: Diagnosis not present

## 2021-08-13 DIAGNOSIS — I10 Essential (primary) hypertension: Secondary | ICD-10-CM | POA: Diagnosis not present

## 2021-08-13 DIAGNOSIS — Z0001 Encounter for general adult medical examination with abnormal findings: Secondary | ICD-10-CM | POA: Diagnosis not present

## 2021-08-13 DIAGNOSIS — E7800 Pure hypercholesterolemia, unspecified: Secondary | ICD-10-CM | POA: Diagnosis not present

## 2021-08-13 DIAGNOSIS — M797 Fibromyalgia: Secondary | ICD-10-CM | POA: Diagnosis not present

## 2021-08-13 DIAGNOSIS — R7303 Prediabetes: Secondary | ICD-10-CM | POA: Diagnosis not present

## 2021-08-13 DIAGNOSIS — Z8719 Personal history of other diseases of the digestive system: Secondary | ICD-10-CM | POA: Diagnosis not present

## 2021-08-13 DIAGNOSIS — G4733 Obstructive sleep apnea (adult) (pediatric): Secondary | ICD-10-CM | POA: Diagnosis not present

## 2021-08-13 DIAGNOSIS — J302 Other seasonal allergic rhinitis: Secondary | ICD-10-CM | POA: Diagnosis not present

## 2021-08-13 DIAGNOSIS — K219 Gastro-esophageal reflux disease without esophagitis: Secondary | ICD-10-CM | POA: Diagnosis not present

## 2021-11-13 DIAGNOSIS — M797 Fibromyalgia: Secondary | ICD-10-CM | POA: Diagnosis not present

## 2021-11-13 DIAGNOSIS — R7303 Prediabetes: Secondary | ICD-10-CM | POA: Diagnosis not present

## 2021-11-13 DIAGNOSIS — I10 Essential (primary) hypertension: Secondary | ICD-10-CM | POA: Diagnosis not present

## 2021-11-13 DIAGNOSIS — E7800 Pure hypercholesterolemia, unspecified: Secondary | ICD-10-CM | POA: Diagnosis not present

## 2021-11-13 DIAGNOSIS — M79672 Pain in left foot: Secondary | ICD-10-CM | POA: Diagnosis not present

## 2021-11-13 DIAGNOSIS — Z8719 Personal history of other diseases of the digestive system: Secondary | ICD-10-CM | POA: Diagnosis not present

## 2021-11-13 DIAGNOSIS — G4733 Obstructive sleep apnea (adult) (pediatric): Secondary | ICD-10-CM | POA: Diagnosis not present

## 2021-11-13 DIAGNOSIS — K219 Gastro-esophageal reflux disease without esophagitis: Secondary | ICD-10-CM | POA: Diagnosis not present

## 2021-11-13 DIAGNOSIS — J302 Other seasonal allergic rhinitis: Secondary | ICD-10-CM | POA: Diagnosis not present

## 2021-11-14 DIAGNOSIS — Z1231 Encounter for screening mammogram for malignant neoplasm of breast: Secondary | ICD-10-CM | POA: Diagnosis not present

## 2021-11-25 ENCOUNTER — Ambulatory Visit (INDEPENDENT_AMBULATORY_CARE_PROVIDER_SITE_OTHER): Payer: Medicare Other

## 2021-11-25 ENCOUNTER — Ambulatory Visit (INDEPENDENT_AMBULATORY_CARE_PROVIDER_SITE_OTHER): Payer: Medicare Other | Admitting: Podiatry

## 2021-11-25 DIAGNOSIS — M79671 Pain in right foot: Secondary | ICD-10-CM | POA: Diagnosis not present

## 2021-11-25 DIAGNOSIS — M7752 Other enthesopathy of left foot: Secondary | ICD-10-CM | POA: Diagnosis not present

## 2021-11-25 DIAGNOSIS — R609 Edema, unspecified: Secondary | ICD-10-CM | POA: Diagnosis not present

## 2021-11-25 MED ORDER — METHYLPREDNISOLONE 4 MG PO TBPK: ORAL_TABLET | ORAL | 0 refills | Status: AC

## 2021-11-25 NOTE — Progress Notes (Signed)
Subjective:   Patient ID: Joanna Allen, female   DOB: 46 y.o.   MRN: [id number]   HPI Chief Complaint  Patient presents with   Foot Pain    Patient came in today for a left foot injury in June 2023, patient is having some pain, on the lateral side of the foot,burning shooting pain,  rate of pain 8 1/2 out of 10, TX: massages, X-Rays done today     DOI; Father's Day 2023 MOI: fell through [employer]. The right foot when through and all the wait went to the left foot.   46 year old female presents above concerns.  She states that she had no treatment after the injury. She did get x-ray after which did not show any fracture. The pain is about he same the pain depends on how much she walks or if she steps on sometihhing. It does swell. She has n/t but has firbo but the pain has been more intense.   Review of Systems  All other systems reviewed and are negative.  Past Medical History:  Diagnosis Date   Bronchitis    Fibromyalgia    Hyperlipidemia     Past Surgical History:  Procedure Laterality Date   BUNIONECTOMY  2016   HERNIA REPAIR     TUBAL LIGATION       Current Outpatient Medications:    methylPREDNISolone (MEDROL DOSEPAK) 4 MG TBPK tablet, Take as directed, Disp: 21 tablet, Rfl: 0   acetaminophen (TYLENOL) 500 MG tablet, Take 1,000 mg by mouth every 6 (six) hours as needed for mild pain., Disp: , Rfl:    albuterol (PROVENTIL HFA;VENTOLIN HFA) 108 (90 Base) MCG/ACT inhaler, Inhale into the lungs every 6 (six) hours as needed for wheezing or shortness of breath., Disp: , Rfl:    atorvastatin (LIPITOR) 40 MG tablet, Take 40 mg by mouth daily., Disp: , Rfl:    cetirizine (ZYRTEC) 10 MG tablet, Take 10 mg by mouth daily., Disp: , Rfl: 0   cyclobenzaprine (FLEXERIL) 10 MG tablet, Take 10 mg by mouth 3 (three) times daily as needed for muscle spasms., Disp: , Rfl:    diclofenac (VOLTAREN) 75 MG EC tablet, , Disp: , Rfl:    ergocalciferol (VITAMIN D2) 50000 units capsule,  Take 50,000 Units by mouth once a week., Disp: , Rfl:    gabapentin (NEURONTIN) 300 MG capsule, TK ONE C PO  BID, Disp: , Rfl:    ibuprofen (ADVIL,MOTRIN) 600 MG tablet, Take 1 tablet (600 mg total) by mouth every 6 (six) hours as needed., Disp: 30 tablet, Rfl: 0   meloxicam (MOBIC) 15 MG tablet, TK 1 T PO QD, Disp: , Rfl:    NUCYNTA ER 100 MG 12 hr tablet, , Disp: , Rfl: 0   pantoprazole (PROTONIX) 40 MG tablet, TK 1 T PO QD, Disp: , Rfl:    phentermine 37.5 MG capsule, Take 37.5 mg by mouth every morning., Disp: , Rfl:    pregabalin (LYRICA) 50 MG capsule, Take 50 mg by mouth 2 (two) times daily., Disp: , Rfl:    ranitidine (ZANTAC) 300 MG tablet, Take 300 mg by mouth at bedtime., Disp: , Rfl: 0   tiZANidine (ZANAFLEX) 4 MG tablet, Take 4-8 mg by mouth every 8 (eight) hours as needed., Disp: , Rfl:   Allergies  Allergen Reactions   Amoxicillin Hives           Objective:  Physical Exam  General: AAO x3, NAD  Dermatological: Skin is warm, dry and supple  bilateral.  There are no open sores, no preulcerative lesions, no rash or signs of infection present.  Vascular: Dorsalis Pedis artery and Posterior Tibial artery pedal pulses are 2/4 bilateral with immedate capillary fill time. There is no pain with calf compression, swelling, warmth, erythema.   Neruologic: Grossly intact via light touch bilateral.  Negative Tinel sign.  Musculoskeletal: There is tenderness palpation the course of the, flexor tendon of the ankle but clinically the tendons are intact.  There is no pain with Achilles tendon [place] test is negative.  Not able to appreciate any area pinpoint tenderness to the foot or ankle.  Mild edema present.  No erythema or warmth.  Gait: Unassisted, Nonantalgic.       Assessment:   Tendinitis     Plan:  -Treatment options discussed including all alternatives, risks, and complications -Etiology of symptoms were discussed -X-rays were obtained and reviewed with the  patient.  3 views of the left foot and 2 views of the left ankle were obtained.  There is no evidence of acute fracture noted today.  Chronic changes present of the fifth metatarsal diaphysis. -Given continued pain and swelling redness prescribed Medrol Dosepak.  While on this hold off on anti-inflammatories.  Trial of ankle brace was dispensed with soft tissue mobilization to facilitate healing.  As she starts to improve she can transition to regular shoe as tolerated.  Continue ice, elevate.  Trula Slade DPM

## 2021-11-25 NOTE — Patient Instructions (Signed)
For instructions on how to put on your Tri-Lock Ankle Brace, please visit [email or web address]  Hold off on the meloxicam (mobic) while on the steroids.   If was nice to meet you today. If you have any questions or any further concerns, please feel fee to give me a call. You can call our office at [phone] or please feel fee to send me a message through [place].

## 2021-12-02 ENCOUNTER — Other Ambulatory Visit: Payer: Self-pay | Admitting: Podiatry

## 2021-12-02 DIAGNOSIS — M79671 Pain in right foot: Secondary | ICD-10-CM

## 2021-12-02 DIAGNOSIS — R609 Edema, unspecified: Secondary | ICD-10-CM

## 2021-12-02 DIAGNOSIS — M7752 Other enthesopathy of left foot: Secondary | ICD-10-CM

## 2022-01-08 ENCOUNTER — Ambulatory Visit (INDEPENDENT_AMBULATORY_CARE_PROVIDER_SITE_OTHER): Payer: Medicare Other | Admitting: Podiatry

## 2022-01-08 VITALS — BP 109/61

## 2022-01-08 DIAGNOSIS — S96912D Strain of unspecified muscle and tendon at ankle and foot level, left foot, subsequent encounter: Secondary | ICD-10-CM

## 2022-01-08 DIAGNOSIS — R609 Edema, unspecified: Secondary | ICD-10-CM

## 2022-01-08 NOTE — Progress Notes (Signed)
Subjective: Chief Complaint  Patient presents with   Follow-up    Left foot pain is worse. 10/10 pain. Ankle swelling and side of foot. Medication that was prescribe during the last visit did not help.   DOI: 06/03/2021 MOI: The right foot went through and all the weight went on the left  States that she is doing about the same or worse.  She still getting swelling mostly in the lateral aspect of the left side.  There is no concerns.  Objective: AAO x3, NAD DP/PT pulses palpable bilaterally, CRT less than 3 seconds While there is discomfort along the course of the flexor tendons most of this has been the lateral aspect today and there is localized edema present to this area.  MMT 4/5 on the left.  Tenderness. No pain with calf compression, swelling, warmth, erythema  Assessment: Concern for tendon tear left  Plan: -All treatment options discussed with the patient including all alternatives, risks, complications.  -Breathing actually started to worsen despite conservative care would order an MRI injury, tear.  Continue immobilization in brace for now.  She has not been taking anti-inflammatories so I prefer to get back on this.  Continue ice daily. -Patient encouraged to call the office with any questions, concerns, change in symptoms.   Trula Slade DPM

## 2022-02-01 ENCOUNTER — Ambulatory Visit
Admission: RE | Admit: 2022-02-01 | Discharge: 2022-02-01 | Disposition: A | Discharge status: Home / Self Care | Payer: 59 | Source: Ambulatory Visit | Attending: Podiatry | Admitting: Podiatry

## 2022-02-01 DIAGNOSIS — M79672 Pain in left foot: Secondary | ICD-10-CM | POA: Diagnosis not present

## 2022-02-01 DIAGNOSIS — S96912D Strain of unspecified muscle and tendon at ankle and foot level, left foot, subsequent encounter: Secondary | ICD-10-CM

## 2022-02-01 DIAGNOSIS — R600 Localized edema: Secondary | ICD-10-CM | POA: Diagnosis not present

## 2022-02-04 ENCOUNTER — Telehealth: Payer: Self-pay | Admitting: Podiatry

## 2022-02-04 NOTE — Telephone Encounter (Signed)
Left message for pt to call to schedule an appt to discuss mri results

## 2022-02-04 NOTE — Telephone Encounter (Signed)
-----  Message from Trula Slade, DPM sent at 02/04/2022 12:29 PM EST ----- Attempted to call patient, no answer. Will send mychart message  Dawn- Please schedule for a follow up in a few weeks. Thanks!

## 2022-02-12 DIAGNOSIS — Z8719 Personal history of other diseases of the digestive system: Secondary | ICD-10-CM | POA: Diagnosis not present

## 2022-02-12 DIAGNOSIS — Z0000 Encounter for general adult medical examination without abnormal findings: Secondary | ICD-10-CM | POA: Diagnosis not present

## 2022-02-12 DIAGNOSIS — I10 Essential (primary) hypertension: Secondary | ICD-10-CM | POA: Diagnosis not present

## 2022-02-12 DIAGNOSIS — R7303 Prediabetes: Secondary | ICD-10-CM | POA: Diagnosis not present

## 2022-02-12 DIAGNOSIS — J302 Other seasonal allergic rhinitis: Secondary | ICD-10-CM | POA: Diagnosis not present

## 2022-02-12 DIAGNOSIS — M797 Fibromyalgia: Secondary | ICD-10-CM | POA: Diagnosis not present

## 2022-02-12 DIAGNOSIS — K219 Gastro-esophageal reflux disease without esophagitis: Secondary | ICD-10-CM | POA: Diagnosis not present

## 2022-02-12 DIAGNOSIS — G4733 Obstructive sleep apnea (adult) (pediatric): Secondary | ICD-10-CM | POA: Diagnosis not present

## 2022-02-12 DIAGNOSIS — E7800 Pure hypercholesterolemia, unspecified: Secondary | ICD-10-CM | POA: Diagnosis not present

## 2022-02-26 ENCOUNTER — Ambulatory Visit (INDEPENDENT_AMBULATORY_CARE_PROVIDER_SITE_OTHER): Payer: 59 | Admitting: Podiatry

## 2022-02-26 DIAGNOSIS — T148XXA Other injury of unspecified body region, initial encounter: Secondary | ICD-10-CM

## 2022-02-26 DIAGNOSIS — M79672 Pain in left foot: Secondary | ICD-10-CM | POA: Diagnosis not present

## 2022-02-26 MED ORDER — CELECOXIB 100 MG PO CAPS: 100.0000 mg | ORAL_CAPSULE | Freq: Two times a day (BID) | ORAL | 0 refills | Status: DC | PRN

## 2022-02-26 NOTE — Progress Notes (Signed)
Subjective: Chief Complaint  Patient presents with   Foot Pain    Left foot tendon tear, MRI results     DOI: 06/03/2021 MOI: The right foot went through and all the weight went on the left  States that she is doing about the same.  Presented MRI results.  Ankle brace not been helpful.  No recent injury or change otherwise.    Objective: AAO x3, NAD DP/PT pulses palpable bilaterally, CRT less than 3 seconds Tenderness today on exam not significant to the ankle mostly to the dorsal lateral aspect of the forefoot on the metatarsals 4 and 5.  This does correspond to the area of concern on the MRI.  Flexor, extensor tendons appear to be intact.  MMT 5/5. No pain with calf compression, swelling, warmth, erythema  Assessment: Bone contusions left side  Plan: -All treatment options discussed with the patient including all alternatives, risks, complications.  -Given ongoing nature of her symptoms recommend immobilization in CAM boot was dispensed.  Prescribed Celebrex to take as needed.  Ice, elevation.  Return in about 4 weeks (around 03/26/2022) for bone contusions.  Trula Slade DPM

## 2022-03-10 ENCOUNTER — Other Ambulatory Visit: Payer: Self-pay | Admitting: Podiatry

## 2022-03-12 ENCOUNTER — Other Ambulatory Visit: Payer: Self-pay | Admitting: Physician Assistant

## 2022-03-12 DIAGNOSIS — M797 Fibromyalgia: Secondary | ICD-10-CM | POA: Diagnosis not present

## 2022-03-12 DIAGNOSIS — G4733 Obstructive sleep apnea (adult) (pediatric): Secondary | ICD-10-CM | POA: Diagnosis not present

## 2022-03-12 DIAGNOSIS — K219 Gastro-esophageal reflux disease without esophagitis: Secondary | ICD-10-CM | POA: Diagnosis not present

## 2022-03-12 DIAGNOSIS — Z1231 Encounter for screening mammogram for malignant neoplasm of breast: Secondary | ICD-10-CM

## 2022-03-12 DIAGNOSIS — J302 Other seasonal allergic rhinitis: Secondary | ICD-10-CM | POA: Diagnosis not present

## 2022-03-12 DIAGNOSIS — R7303 Prediabetes: Secondary | ICD-10-CM | POA: Diagnosis not present

## 2022-03-12 DIAGNOSIS — E7800 Pure hypercholesterolemia, unspecified: Secondary | ICD-10-CM | POA: Diagnosis not present

## 2022-03-12 DIAGNOSIS — M79672 Pain in left foot: Secondary | ICD-10-CM | POA: Diagnosis not present

## 2022-03-12 DIAGNOSIS — I10 Essential (primary) hypertension: Secondary | ICD-10-CM | POA: Diagnosis not present

## 2022-03-12 DIAGNOSIS — Z8719 Personal history of other diseases of the digestive system: Secondary | ICD-10-CM | POA: Diagnosis not present

## 2022-03-23 ENCOUNTER — Other Ambulatory Visit: Payer: Self-pay | Admitting: Podiatry

## 2022-03-26 ENCOUNTER — Ambulatory Visit (INDEPENDENT_AMBULATORY_CARE_PROVIDER_SITE_OTHER): Payer: 59 | Admitting: Podiatry

## 2022-03-26 DIAGNOSIS — T148XXA Other injury of unspecified body region, initial encounter: Secondary | ICD-10-CM

## 2022-03-26 NOTE — Progress Notes (Signed)
Subjective: Chief Complaint  Patient presents with   Follow-up    Patient still experiencing pain in the left foot, pain located on the lateral side of foot, patient notices pain in daily movements      DOI: 06/03/2021 MOI: The right foot went through and all the weight went on the left.   States that she is doing about the same.  She is doing well with the cam boot.  She still gets tenderness posterior lateral aspect of the foot.  Overall unchanged.  Objective: AAO x3, NAD DP/PT pulses palpable bilaterally, CRT less than 3 seconds Tenderness today on exam not significant to the ankle mostly to the dorsal lateral aspect of the forefoot on the metatarsals 4 and 5.  She still had pain in the same areas.  This does correspond to the area of concern on the MRI.  Flexor, extensor tendons appear to be intact.  MMT 5/5. No pain with calf compression, swelling, warmth, erythema  Assessment: Bone contusions left side  Plan: -All treatment options discussed with the patient including all alternatives, risks, complications.  -I dispensed a graphite insert to wear into regular shoe as she starts to transition to regular shoe when she feels better, muscle and physical therapy to see if decreases other modalities will be helpful for her.  Will try to order a bone scan.  See if this can be approved given her ongoing symptoms with the bone contusions.  No follow-ups on file.  Trula Slade DPM

## 2022-04-01 DIAGNOSIS — R2681 Unsteadiness on feet: Secondary | ICD-10-CM | POA: Diagnosis not present

## 2022-04-01 DIAGNOSIS — M25572 Pain in left ankle and joints of left foot: Secondary | ICD-10-CM | POA: Diagnosis not present

## 2022-04-01 DIAGNOSIS — M62572 Muscle wasting and atrophy, not elsewhere classified, left ankle and foot: Secondary | ICD-10-CM | POA: Diagnosis not present

## 2022-04-07 DIAGNOSIS — T148XXA Other injury of unspecified body region, initial encounter: Secondary | ICD-10-CM | POA: Diagnosis not present

## 2022-04-14 DIAGNOSIS — M25572 Pain in left ankle and joints of left foot: Secondary | ICD-10-CM | POA: Diagnosis not present

## 2022-04-14 DIAGNOSIS — R2681 Unsteadiness on feet: Secondary | ICD-10-CM | POA: Diagnosis not present

## 2022-04-14 DIAGNOSIS — M62572 Muscle wasting and atrophy, not elsewhere classified, left ankle and foot: Secondary | ICD-10-CM | POA: Diagnosis not present

## 2022-04-16 DIAGNOSIS — M62572 Muscle wasting and atrophy, not elsewhere classified, left ankle and foot: Secondary | ICD-10-CM | POA: Diagnosis not present

## 2022-04-16 DIAGNOSIS — R2681 Unsteadiness on feet: Secondary | ICD-10-CM | POA: Diagnosis not present

## 2022-04-16 DIAGNOSIS — M25572 Pain in left ankle and joints of left foot: Secondary | ICD-10-CM | POA: Diagnosis not present

## 2022-04-21 DIAGNOSIS — M25572 Pain in left ankle and joints of left foot: Secondary | ICD-10-CM | POA: Diagnosis not present

## 2022-04-21 DIAGNOSIS — M62572 Muscle wasting and atrophy, not elsewhere classified, left ankle and foot: Secondary | ICD-10-CM | POA: Diagnosis not present

## 2022-04-21 DIAGNOSIS — R2681 Unsteadiness on feet: Secondary | ICD-10-CM | POA: Diagnosis not present

## 2022-04-22 DIAGNOSIS — E7800 Pure hypercholesterolemia, unspecified: Secondary | ICD-10-CM | POA: Diagnosis not present

## 2022-04-22 DIAGNOSIS — I10 Essential (primary) hypertension: Secondary | ICD-10-CM | POA: Diagnosis not present

## 2022-04-22 DIAGNOSIS — R7303 Prediabetes: Secondary | ICD-10-CM | POA: Diagnosis not present

## 2022-04-22 DIAGNOSIS — J302 Other seasonal allergic rhinitis: Secondary | ICD-10-CM | POA: Diagnosis not present

## 2022-04-22 DIAGNOSIS — Z8719 Personal history of other diseases of the digestive system: Secondary | ICD-10-CM | POA: Diagnosis not present

## 2022-04-22 DIAGNOSIS — G4733 Obstructive sleep apnea (adult) (pediatric): Secondary | ICD-10-CM | POA: Diagnosis not present

## 2022-04-22 DIAGNOSIS — M797 Fibromyalgia: Secondary | ICD-10-CM | POA: Diagnosis not present

## 2022-04-22 DIAGNOSIS — K219 Gastro-esophageal reflux disease without esophagitis: Secondary | ICD-10-CM | POA: Diagnosis not present

## 2022-04-28 DIAGNOSIS — M62572 Muscle wasting and atrophy, not elsewhere classified, left ankle and foot: Secondary | ICD-10-CM | POA: Diagnosis not present

## 2022-04-28 DIAGNOSIS — M25572 Pain in left ankle and joints of left foot: Secondary | ICD-10-CM | POA: Diagnosis not present

## 2022-04-28 DIAGNOSIS — R2681 Unsteadiness on feet: Secondary | ICD-10-CM | POA: Diagnosis not present

## 2022-05-01 ENCOUNTER — Encounter: Payer: Self-pay | Admitting: Podiatry

## 2022-05-01 ENCOUNTER — Ambulatory Visit (INDEPENDENT_AMBULATORY_CARE_PROVIDER_SITE_OTHER): Payer: 59 | Admitting: Podiatry

## 2022-05-01 DIAGNOSIS — E559 Vitamin D deficiency, unspecified: Secondary | ICD-10-CM | POA: Diagnosis not present

## 2022-05-01 DIAGNOSIS — T148XXA Other injury of unspecified body region, initial encounter: Secondary | ICD-10-CM | POA: Diagnosis not present

## 2022-05-02 LAB — CBC WITH DIFFERENTIAL/PLATELET
Basophils Absolute: 0 10*3/uL (ref 0.0–0.2)
Basos: 1 %
EOS (ABSOLUTE): 0.2 10*3/uL (ref 0.0–0.4)
Eos: 4 %
Hematocrit: 36.5 % (ref 34.0–46.6)
Hemoglobin: 12.2 g/dL (ref 11.1–15.9)
Immature Grans (Abs): 0 10*3/uL (ref 0.0–0.1)
Immature Granulocytes: 0 %
Lymphocytes Absolute: 1.9 10*3/uL (ref 0.7–3.1)
Lymphs: 47 %
MCH: 27.7 pg (ref 26.6–33.0)
MCHC: 33.4 g/dL (ref 31.5–35.7)
MCV: 83 fL (ref 79–97)
Monocytes Absolute: 0.4 10*3/uL (ref 0.1–0.9)
Monocytes: 10 %
Neutrophils Absolute: 1.5 10*3/uL (ref 1.4–7.0)
Neutrophils: 38 %
Platelets: 286 10*3/uL (ref 150–450)
RBC: 4.41 x10E6/uL (ref 3.77–5.28)
RDW: 13.2 % (ref 11.7–15.4)
WBC: 3.9 10*3/uL (ref 3.4–10.8)

## 2022-05-02 LAB — COMPREHENSIVE METABOLIC PANEL
ALT: 12 IU/L (ref 0–32)
AST: 13 IU/L (ref 0–40)
Albumin/Globulin Ratio: 1.4 (ref 1.2–2.2)
Albumin: 4.1 g/dL (ref 3.9–4.9)
Alkaline Phosphatase: 70 IU/L (ref 44–121)
BUN/Creatinine Ratio: 15 (ref 9–23)
BUN: 14 mg/dL (ref 6–24)
Bilirubin Total: 0.3 mg/dL (ref 0.0–1.2)
CO2: 22 mmol/L (ref 20–29)
Calcium: 9.6 mg/dL (ref 8.7–10.2)
Chloride: 103 mmol/L (ref 96–106)
Creatinine, Ser: 0.92 mg/dL (ref 0.57–1.00)
Globulin, Total: 2.9 g/dL (ref 1.5–4.5)
Glucose: 80 mg/dL (ref 70–99)
Potassium: 4.4 mmol/L (ref 3.5–5.2)
Sodium: 137 mmol/L (ref 134–144)
Total Protein: 7 g/dL (ref 6.0–8.5)
eGFR: 77 mL/min/{1.73_m2} (ref >59)

## 2022-05-02 LAB — MAGNESIUM: Magnesium: 2 mg/dL (ref 1.6–2.3)

## 2022-05-02 LAB — VITAMIN D 25 HYDROXY (VIT D DEFICIENCY, FRACTURES): Vit D, 25-Hydroxy: 41.9 ng/mL (ref 30.0–100.0)

## 2022-05-05 NOTE — Progress Notes (Signed)
Subjective: Chief Complaint  Patient presents with   Foot Injury    Follow up bone contusion left   "PT said I had more mobility, but it still hurts"    DOI: 06/03/2021 MOI: The right foot went through and all the weight went on the left.   States that she is doing about the same.  She is doing physical therapy.  She some discomfort availability is increased.  She is back up on a regular shoe.  She just recently had a bone stimulator that she started to use.  Its only been a little over a week. Objective: AAO x3, NAD DP/PT pulses palpable bilaterally, CRT less than 3 seconds Tenderness today on exam not significant to the ankle mostly to the dorsal lateral aspect of the forefoot on the metatarsals 4 and 5.  She still had pain in the same areas.  This does correspond to the area of concern on the MRI.  Flexor, extensor tendons appear to be intact.  MMT 5/5.  Overall exam appears to be unchanged. No pain with calf compression, swelling, warmth, erythema  Assessment: Bone contusions left side  Plan: -All treatment options discussed with the patient including all alternatives, risks, complications.  -Continue bone stimulator.  Will order blood work, ordered vitamin D, CBC, CMP, magnesium.  Will also continue physical therapy.  Return in about 4 weeks (around 05/29/2022) for bone contusion, x-ray.  Vivi Barrack DPM

## 2022-05-29 ENCOUNTER — Ambulatory Visit (INDEPENDENT_AMBULATORY_CARE_PROVIDER_SITE_OTHER): Payer: 59 | Admitting: Podiatry

## 2022-05-29 ENCOUNTER — Ambulatory Visit (INDEPENDENT_AMBULATORY_CARE_PROVIDER_SITE_OTHER): Payer: 59

## 2022-05-29 DIAGNOSIS — T148XXA Other injury of unspecified body region, initial encounter: Secondary | ICD-10-CM | POA: Diagnosis not present

## 2022-05-29 DIAGNOSIS — M79672 Pain in left foot: Secondary | ICD-10-CM | POA: Diagnosis not present

## 2022-05-29 NOTE — Progress Notes (Signed)
Subjective: Chief Complaint  Patient presents with   Follow-up    4 week follow up for bone contusion to the left foot. Patient stated she is slightly worst than her fist visit. Same location as previously.    47 year old female presents for above concerns.  States that pain is getting worse but physical therapy is helping with range of motion and she is still doing this.  Objective: AAO x3, NAD DP/PT pulses palpable bilaterally, CRT less than 3 seconds There is continuation tenderness to the left foot on the fourth, fifth metatarsal bases.  There is no something there is no erythema or warmth.  There is no air discomfort.  Flexor, extensor tendons appear to be intact. No pain with calf compression, swelling, warmth, erythema  Assessment: 47 year old female with bone contusions  Plan: -All treatment options discussed with the patient including all alternatives, risks, complications.  -X-rays obtained reviewed.  3 views of the feet were obtained.  No definitive signs of acute fracture noted today. -Given ongoing symptoms I would expect this to have resolved by now.  Will order CT scan.  Will try to get a second opinion on the MRI that was done previously.  Continue physical therapy and consider adding under pheresis. -Patient encouraged to call the office with any questions, concerns, change in symptoms.   Vivi Barrack DPM

## 2022-06-02 DIAGNOSIS — M62572 Muscle wasting and atrophy, not elsewhere classified, left ankle and foot: Secondary | ICD-10-CM | POA: Diagnosis not present

## 2022-06-02 DIAGNOSIS — R2681 Unsteadiness on feet: Secondary | ICD-10-CM | POA: Diagnosis not present

## 2022-06-02 DIAGNOSIS — M25572 Pain in left ankle and joints of left foot: Secondary | ICD-10-CM | POA: Diagnosis not present

## 2022-06-04 DIAGNOSIS — M62572 Muscle wasting and atrophy, not elsewhere classified, left ankle and foot: Secondary | ICD-10-CM | POA: Diagnosis not present

## 2022-06-04 DIAGNOSIS — R2681 Unsteadiness on feet: Secondary | ICD-10-CM | POA: Diagnosis not present

## 2022-06-04 DIAGNOSIS — M25572 Pain in left ankle and joints of left foot: Secondary | ICD-10-CM | POA: Diagnosis not present

## 2022-06-05 DIAGNOSIS — E7800 Pure hypercholesterolemia, unspecified: Secondary | ICD-10-CM | POA: Diagnosis not present

## 2022-06-05 DIAGNOSIS — I10 Essential (primary) hypertension: Secondary | ICD-10-CM | POA: Diagnosis not present

## 2022-06-16 DIAGNOSIS — M25572 Pain in left ankle and joints of left foot: Secondary | ICD-10-CM | POA: Diagnosis not present

## 2022-06-16 DIAGNOSIS — M62572 Muscle wasting and atrophy, not elsewhere classified, left ankle and foot: Secondary | ICD-10-CM | POA: Diagnosis not present

## 2022-06-16 DIAGNOSIS — R2681 Unsteadiness on feet: Secondary | ICD-10-CM | POA: Diagnosis not present

## 2022-06-23 ENCOUNTER — Telehealth: Payer: Self-pay | Admitting: *Deleted

## 2022-06-23 DIAGNOSIS — M62572 Muscle wasting and atrophy, not elsewhere classified, left ankle and foot: Secondary | ICD-10-CM | POA: Diagnosis not present

## 2022-06-23 DIAGNOSIS — R2681 Unsteadiness on feet: Secondary | ICD-10-CM | POA: Diagnosis not present

## 2022-06-23 DIAGNOSIS — M25572 Pain in left ankle and joints of left foot: Secondary | ICD-10-CM | POA: Diagnosis not present

## 2022-06-23 NOTE — Telephone Encounter (Signed)
Faxed request to [facility] for over read  Faxed request to [facility] for disc to be sent to [facility]

## 2022-06-23 NOTE — Telephone Encounter (Signed)
-----   Message from Vivi Barrack, DPM sent at 06/05/2022  2:52 PM EDT ----- Can you help me send an over read on her previous MRI? Thanks!

## 2022-06-23 NOTE — Telephone Encounter (Signed)
sent 

## 2022-06-25 DIAGNOSIS — R2681 Unsteadiness on feet: Secondary | ICD-10-CM | POA: Diagnosis not present

## 2022-06-25 DIAGNOSIS — M25572 Pain in left ankle and joints of left foot: Secondary | ICD-10-CM | POA: Diagnosis not present

## 2022-06-25 DIAGNOSIS — M62572 Muscle wasting and atrophy, not elsewhere classified, left ankle and foot: Secondary | ICD-10-CM | POA: Diagnosis not present

## 2022-06-30 ENCOUNTER — Ambulatory Visit
Admission: RE | Admit: 2022-06-30 | Discharge: 2022-06-30 | Disposition: A | Discharge status: Home / Self Care | Payer: 59 | Source: Ambulatory Visit | Attending: Podiatry | Admitting: Podiatry

## 2022-06-30 DIAGNOSIS — T148XXA Other injury of unspecified body region, initial encounter: Secondary | ICD-10-CM

## 2022-06-30 DIAGNOSIS — M79672 Pain in left foot: Secondary | ICD-10-CM | POA: Diagnosis not present

## 2022-07-02 DIAGNOSIS — M62572 Muscle wasting and atrophy, not elsewhere classified, left ankle and foot: Secondary | ICD-10-CM | POA: Diagnosis not present

## 2022-07-02 DIAGNOSIS — R2681 Unsteadiness on feet: Secondary | ICD-10-CM | POA: Diagnosis not present

## 2022-07-02 DIAGNOSIS — M25572 Pain in left ankle and joints of left foot: Secondary | ICD-10-CM | POA: Diagnosis not present

## 2022-07-14 DIAGNOSIS — M25572 Pain in left ankle and joints of left foot: Secondary | ICD-10-CM | POA: Diagnosis not present

## 2022-07-14 DIAGNOSIS — R2681 Unsteadiness on feet: Secondary | ICD-10-CM | POA: Diagnosis not present

## 2022-07-14 DIAGNOSIS — M62572 Muscle wasting and atrophy, not elsewhere classified, left ankle and foot: Secondary | ICD-10-CM | POA: Diagnosis not present

## 2022-07-16 DIAGNOSIS — M25572 Pain in left ankle and joints of left foot: Secondary | ICD-10-CM | POA: Diagnosis not present

## 2022-07-16 DIAGNOSIS — R2681 Unsteadiness on feet: Secondary | ICD-10-CM | POA: Diagnosis not present

## 2022-07-16 DIAGNOSIS — M62572 Muscle wasting and atrophy, not elsewhere classified, left ankle and foot: Secondary | ICD-10-CM | POA: Diagnosis not present

## 2022-07-28 DIAGNOSIS — M25572 Pain in left ankle and joints of left foot: Secondary | ICD-10-CM | POA: Diagnosis not present

## 2022-07-28 DIAGNOSIS — M62572 Muscle wasting and atrophy, not elsewhere classified, left ankle and foot: Secondary | ICD-10-CM | POA: Diagnosis not present

## 2022-07-28 DIAGNOSIS — R2681 Unsteadiness on feet: Secondary | ICD-10-CM | POA: Diagnosis not present

## 2022-07-30 DIAGNOSIS — R2681 Unsteadiness on feet: Secondary | ICD-10-CM | POA: Diagnosis not present

## 2022-07-30 DIAGNOSIS — M25572 Pain in left ankle and joints of left foot: Secondary | ICD-10-CM | POA: Diagnosis not present

## 2022-07-30 DIAGNOSIS — M62572 Muscle wasting and atrophy, not elsewhere classified, left ankle and foot: Secondary | ICD-10-CM | POA: Diagnosis not present

## 2022-08-06 DIAGNOSIS — M62572 Muscle wasting and atrophy, not elsewhere classified, left ankle and foot: Secondary | ICD-10-CM | POA: Diagnosis not present

## 2022-08-06 DIAGNOSIS — R2681 Unsteadiness on feet: Secondary | ICD-10-CM | POA: Diagnosis not present

## 2022-08-06 DIAGNOSIS — M25572 Pain in left ankle and joints of left foot: Secondary | ICD-10-CM | POA: Diagnosis not present

## 2022-08-11 ENCOUNTER — Telehealth: Payer: Self-pay | Admitting: Podiatry

## 2022-08-11 NOTE — Telephone Encounter (Signed)
pt called about MRI test results

## 2022-08-27 DIAGNOSIS — R2681 Unsteadiness on feet: Secondary | ICD-10-CM | POA: Diagnosis not present

## 2022-08-27 DIAGNOSIS — M62572 Muscle wasting and atrophy, not elsewhere classified, left ankle and foot: Secondary | ICD-10-CM | POA: Diagnosis not present

## 2022-08-27 DIAGNOSIS — M25572 Pain in left ankle and joints of left foot: Secondary | ICD-10-CM | POA: Diagnosis not present

## 2022-09-03 DIAGNOSIS — M62572 Muscle wasting and atrophy, not elsewhere classified, left ankle and foot: Secondary | ICD-10-CM | POA: Diagnosis not present

## 2022-09-03 DIAGNOSIS — R2681 Unsteadiness on feet: Secondary | ICD-10-CM | POA: Diagnosis not present

## 2022-09-03 DIAGNOSIS — M25572 Pain in left ankle and joints of left foot: Secondary | ICD-10-CM | POA: Diagnosis not present

## 2022-10-09 DIAGNOSIS — M797 Fibromyalgia: Secondary | ICD-10-CM | POA: Diagnosis not present

## 2022-10-09 DIAGNOSIS — K219 Gastro-esophageal reflux disease without esophagitis: Secondary | ICD-10-CM | POA: Diagnosis not present

## 2022-10-09 DIAGNOSIS — R7303 Prediabetes: Secondary | ICD-10-CM | POA: Diagnosis not present

## 2022-10-09 DIAGNOSIS — E7800 Pure hypercholesterolemia, unspecified: Secondary | ICD-10-CM | POA: Diagnosis not present

## 2022-10-09 DIAGNOSIS — Z0001 Encounter for general adult medical examination with abnormal findings: Secondary | ICD-10-CM | POA: Diagnosis not present

## 2022-10-09 DIAGNOSIS — G4733 Obstructive sleep apnea (adult) (pediatric): Secondary | ICD-10-CM | POA: Diagnosis not present

## 2022-10-09 DIAGNOSIS — Z8719 Personal history of other diseases of the digestive system: Secondary | ICD-10-CM | POA: Diagnosis not present

## 2022-10-09 DIAGNOSIS — J302 Other seasonal allergic rhinitis: Secondary | ICD-10-CM | POA: Diagnosis not present

## 2022-10-09 DIAGNOSIS — I10 Essential (primary) hypertension: Secondary | ICD-10-CM | POA: Diagnosis not present

## 2022-10-23 DIAGNOSIS — G4733 Obstructive sleep apnea (adult) (pediatric): Secondary | ICD-10-CM | POA: Diagnosis not present

## 2022-10-23 DIAGNOSIS — J302 Other seasonal allergic rhinitis: Secondary | ICD-10-CM | POA: Diagnosis not present

## 2022-10-23 DIAGNOSIS — M797 Fibromyalgia: Secondary | ICD-10-CM | POA: Diagnosis not present

## 2022-10-23 DIAGNOSIS — E7800 Pure hypercholesterolemia, unspecified: Secondary | ICD-10-CM | POA: Diagnosis not present

## 2022-10-23 DIAGNOSIS — I10 Essential (primary) hypertension: Secondary | ICD-10-CM | POA: Diagnosis not present

## 2022-10-23 DIAGNOSIS — Z8719 Personal history of other diseases of the digestive system: Secondary | ICD-10-CM | POA: Diagnosis not present

## 2022-10-23 DIAGNOSIS — K219 Gastro-esophageal reflux disease without esophagitis: Secondary | ICD-10-CM | POA: Diagnosis not present

## 2022-10-23 DIAGNOSIS — R7303 Prediabetes: Secondary | ICD-10-CM | POA: Diagnosis not present

## 2023-01-05 DIAGNOSIS — Z1231 Encounter for screening mammogram for malignant neoplasm of breast: Secondary | ICD-10-CM | POA: Diagnosis not present

## 2023-03-04 DIAGNOSIS — S63502A Unspecified sprain of left wrist, initial encounter: Secondary | ICD-10-CM | POA: Diagnosis not present

## 2023-03-04 DIAGNOSIS — X509XXA Other and unspecified overexertion or strenuous movements or postures, initial encounter: Secondary | ICD-10-CM | POA: Diagnosis not present

## 2023-03-04 DIAGNOSIS — M79632 Pain in left forearm: Secondary | ICD-10-CM | POA: Diagnosis not present

## 2023-03-29 DIAGNOSIS — M65932 Unspecified synovitis and tenosynovitis, left forearm: Secondary | ICD-10-CM | POA: Diagnosis not present

## 2023-06-11 DIAGNOSIS — G4733 Obstructive sleep apnea (adult) (pediatric): Secondary | ICD-10-CM | POA: Diagnosis not present

## 2023-06-11 DIAGNOSIS — J302 Other seasonal allergic rhinitis: Secondary | ICD-10-CM | POA: Diagnosis not present

## 2023-06-11 DIAGNOSIS — M797 Fibromyalgia: Secondary | ICD-10-CM | POA: Diagnosis not present

## 2023-06-11 DIAGNOSIS — E7800 Pure hypercholesterolemia, unspecified: Secondary | ICD-10-CM | POA: Diagnosis not present

## 2023-06-11 DIAGNOSIS — K219 Gastro-esophageal reflux disease without esophagitis: Secondary | ICD-10-CM | POA: Diagnosis not present

## 2023-06-11 DIAGNOSIS — R7303 Prediabetes: Secondary | ICD-10-CM | POA: Diagnosis not present

## 2023-06-11 DIAGNOSIS — E559 Vitamin D deficiency, unspecified: Secondary | ICD-10-CM | POA: Diagnosis not present

## 2023-06-11 DIAGNOSIS — Z8719 Personal history of other diseases of the digestive system: Secondary | ICD-10-CM | POA: Diagnosis not present

## 2023-06-11 DIAGNOSIS — M5441 Lumbago with sciatica, right side: Secondary | ICD-10-CM | POA: Diagnosis not present

## 2023-06-11 DIAGNOSIS — Z0000 Encounter for general adult medical examination without abnormal findings: Secondary | ICD-10-CM | POA: Diagnosis not present

## 2023-06-11 DIAGNOSIS — I10 Essential (primary) hypertension: Secondary | ICD-10-CM | POA: Diagnosis not present

## 2023-07-13 DIAGNOSIS — E559 Vitamin D deficiency, unspecified: Secondary | ICD-10-CM | POA: Diagnosis not present

## 2023-07-13 DIAGNOSIS — M5441 Lumbago with sciatica, right side: Secondary | ICD-10-CM | POA: Diagnosis not present

## 2023-07-13 DIAGNOSIS — M797 Fibromyalgia: Secondary | ICD-10-CM | POA: Diagnosis not present

## 2023-07-13 DIAGNOSIS — R7303 Prediabetes: Secondary | ICD-10-CM | POA: Diagnosis not present

## 2023-07-13 DIAGNOSIS — K219 Gastro-esophageal reflux disease without esophagitis: Secondary | ICD-10-CM | POA: Diagnosis not present

## 2023-07-13 DIAGNOSIS — G4733 Obstructive sleep apnea (adult) (pediatric): Secondary | ICD-10-CM | POA: Diagnosis not present

## 2023-07-13 DIAGNOSIS — J302 Other seasonal allergic rhinitis: Secondary | ICD-10-CM | POA: Diagnosis not present

## 2023-07-13 DIAGNOSIS — I10 Essential (primary) hypertension: Secondary | ICD-10-CM | POA: Diagnosis not present

## 2023-07-13 DIAGNOSIS — E7800 Pure hypercholesterolemia, unspecified: Secondary | ICD-10-CM | POA: Diagnosis not present
